# Patient Record
Sex: Male | Born: 1940 | Race: Black or African American | Hispanic: No | Marital: Single | Smoking: Former smoker
Health system: Southern US, Community
[De-identification: ages and names within clinical notes are randomized; demographics above are authoritative.]

## PROBLEM LIST (undated history)

## (undated) DIAGNOSIS — G9341 Metabolic encephalopathy: Secondary | ICD-10-CM

## (undated) DIAGNOSIS — J449 Chronic obstructive pulmonary disease, unspecified: Secondary | ICD-10-CM

## (undated) DIAGNOSIS — S0590XA Unspecified injury of unspecified eye and orbit, initial encounter: Secondary | ICD-10-CM

## (undated) DIAGNOSIS — F1910 Other psychoactive substance abuse, uncomplicated: Secondary | ICD-10-CM

## (undated) DIAGNOSIS — N189 Chronic kidney disease, unspecified: Secondary | ICD-10-CM

## (undated) DIAGNOSIS — F1027 Alcohol dependence with alcohol-induced persisting dementia: Secondary | ICD-10-CM

## (undated) DIAGNOSIS — I10 Essential (primary) hypertension: Secondary | ICD-10-CM

## (undated) HISTORY — DX: Chronic obstructive pulmonary disease, unspecified: J44.9

## (undated) HISTORY — DX: Chronic kidney disease, unspecified: N18.9

## (undated) HISTORY — DX: Alcohol dependence with alcohol-induced persisting dementia: F10.27

## (undated) HISTORY — DX: Metabolic encephalopathy: G93.41

## (undated) HISTORY — DX: Other psychoactive substance abuse, uncomplicated: F19.10

## (undated) HISTORY — DX: Essential (primary) hypertension: I10

## (undated) HISTORY — DX: Unspecified injury of unspecified eye and orbit, initial encounter: S05.90XA

---

## 2004-09-27 ENCOUNTER — Emergency Department: Payer: Self-pay | Admitting: Emergency Medicine

## 2004-09-29 ENCOUNTER — Emergency Department: Payer: Self-pay | Admitting: Emergency Medicine

## 2004-10-07 ENCOUNTER — Emergency Department: Payer: Self-pay | Admitting: Emergency Medicine

## 2004-10-12 ENCOUNTER — Emergency Department: Payer: Self-pay | Admitting: Internal Medicine

## 2004-10-16 ENCOUNTER — Emergency Department: Payer: Self-pay | Admitting: Unknown Physician Specialty

## 2013-01-12 ENCOUNTER — Observation Stay: Payer: Self-pay | Admitting: Internal Medicine

## 2013-01-12 LAB — TROPONIN I
Troponin-I: <0.02 ng/mL
Troponin-I: <0.02 ng/mL

## 2013-01-12 LAB — CBC
MCH: 30.8 pg (ref 26.0–34.0)
MCHC: 35.7 g/dL (ref 32.0–36.0)
MCV: 86 fL (ref 80–100)
Platelet: 220 10*3/uL (ref 150–440)
RBC: 4.84 10*6/uL (ref 4.40–5.90)
RDW: 13.9 % (ref 11.5–14.5)
WBC: 6.7 10*3/uL (ref 3.8–10.6)

## 2013-01-12 LAB — CK TOTAL AND CKMB (NOT AT ARMC)
CK, Total: 371 U/L — ABNORMAL HIGH (ref 35–232)
CK, Total: 331 U/L — ABNORMAL HIGH (ref 35–232)
CK-MB: 4.8 ng/mL — ABNORMAL HIGH (ref 0.5–3.6)
CK-MB: 3.6 ng/mL (ref 0.5–3.6)

## 2013-01-12 LAB — URINALYSIS, COMPLETE
Bacteria: NONE SEEN
Bilirubin,UR: NEGATIVE
Blood: NEGATIVE
Glucose,UR: NEGATIVE mg/dL (ref 0–75)
Ketone: NEGATIVE
Leukocyte Esterase: NEGATIVE
Nitrite: NEGATIVE
RBC,UR: <1 /HPF (ref 0–5)
Squamous Epithelial: NONE SEEN
WBC UR: NONE SEEN /HPF (ref 0–5)

## 2013-01-12 LAB — COMPREHENSIVE METABOLIC PANEL
Anion Gap: 7 (ref 7–16)
BUN: 22 mg/dL — ABNORMAL HIGH (ref 7–18)
Bilirubin,Total: 0.8 mg/dL (ref 0.2–1.0)
Co2: 26 mmol/L (ref 21–32)
Glucose: 91 mg/dL (ref 65–99)
Osmolality: 280 (ref 275–301)
Potassium: 4 mmol/L (ref 3.5–5.1)
SGPT (ALT): 47 U/L (ref 12–78)
Total Protein: 7.7 g/dL (ref 6.4–8.2)

## 2013-01-12 LAB — MAGNESIUM: Magnesium: 1.8 mg/dL

## 2013-01-12 LAB — LIPASE, BLOOD: Lipase: 146 U/L (ref 73–393)

## 2013-01-13 LAB — CBC WITH DIFFERENTIAL/PLATELET
Basophil #: 0 10*3/uL (ref 0.0–0.1)
Eosinophil #: 0 10*3/uL (ref 0.0–0.7)
Eosinophil %: 0 %
HGB: 14.2 g/dL (ref 13.0–18.0)
Lymphocyte %: 36.1 %
MCHC: 35.9 g/dL (ref 32.0–36.0)
MCV: 86 fL (ref 80–100)
Platelet: 199 10*3/uL (ref 150–440)
RBC: 4.61 10*6/uL (ref 4.40–5.90)
RDW: 14 % (ref 11.5–14.5)

## 2013-01-13 LAB — COMPREHENSIVE METABOLIC PANEL
Alkaline Phosphatase: 63 U/L (ref 50–136)
Calcium, Total: 9.2 mg/dL (ref 8.5–10.1)
Creatinine: 1.33 mg/dL — ABNORMAL HIGH (ref 0.60–1.30)
EGFR (African American): >60
EGFR (Non-African Amer.): 54 — ABNORMAL LOW
Glucose: 90 mg/dL (ref 65–99)
Osmolality: 281 (ref 275–301)
Potassium: 4 mmol/L (ref 3.5–5.1)
SGPT (ALT): 40 U/L (ref 12–78)
Sodium: 139 mmol/L (ref 136–145)
Total Protein: 7.2 g/dL (ref 6.4–8.2)

## 2013-01-13 LAB — BASIC METABOLIC PANEL
BUN: 24 mg/dL — ABNORMAL HIGH (ref 7–18)
Calcium, Total: 9.3 mg/dL (ref 8.5–10.1)
Co2: 26 mmol/L (ref 21–32)
EGFR (African American): 58 — ABNORMAL LOW
EGFR (Non-African Amer.): 50 — ABNORMAL LOW
Glucose: 97 mg/dL (ref 65–99)
Osmolality: 276 (ref 275–301)
Potassium: 4.1 mmol/L (ref 3.5–5.1)
Sodium: 136 mmol/L (ref 136–145)

## 2013-01-13 LAB — LIPID PANEL
Cholesterol: 204 mg/dL — ABNORMAL HIGH (ref 0–200)
HDL Cholesterol: 83 mg/dL — ABNORMAL HIGH (ref 40–60)
Triglycerides: 83 mg/dL (ref 0–200)
VLDL Cholesterol, Calc: 17 mg/dL (ref 5–40)

## 2013-01-13 LAB — TROPONIN I: Troponin-I: <0.02 ng/mL

## 2013-01-13 LAB — CK TOTAL AND CKMB (NOT AT ARMC)
CK, Total: 315 U/L — ABNORMAL HIGH (ref 35–232)
CK-MB: 3.3 ng/mL (ref 0.5–3.6)

## 2013-03-17 ENCOUNTER — Emergency Department: Payer: Self-pay | Admitting: Emergency Medicine

## 2013-03-17 LAB — CBC
HGB: 14.6 g/dL (ref 13.0–18.0)
MCHC: 36.6 g/dL — ABNORMAL HIGH (ref 32.0–36.0)
MCV: 84 fL (ref 80–100)
Platelet: 225 10*3/uL (ref 150–440)
WBC: 6.3 10*3/uL (ref 3.8–10.6)

## 2013-03-17 LAB — TROPONIN I
Troponin-I: <0.02 ng/mL
Troponin-I: 0.04 ng/mL

## 2013-03-17 LAB — BASIC METABOLIC PANEL
BUN: 26 mg/dL — ABNORMAL HIGH (ref 7–18)
Chloride: 106 mmol/L (ref 98–107)
Creatinine: 1.37 mg/dL — ABNORMAL HIGH (ref 0.60–1.30)
Glucose: 87 mg/dL (ref 65–99)
Osmolality: 278 (ref 275–301)
Sodium: 137 mmol/L (ref 136–145)

## 2014-03-29 ENCOUNTER — Observation Stay: Payer: Self-pay | Admitting: Internal Medicine

## 2014-03-29 LAB — COMPREHENSIVE METABOLIC PANEL
ALBUMIN: 3.9 g/dL (ref 3.4–5.0)
ANION GAP: 8 (ref 7–16)
AST: 30 U/L (ref 15–37)
Alkaline Phosphatase: 65 U/L
BUN: 30 mg/dL — ABNORMAL HIGH (ref 7–18)
Bilirubin,Total: 0.5 mg/dL (ref 0.2–1.0)
CALCIUM: 9 mg/dL (ref 8.5–10.1)
CO2: 25 mmol/L (ref 21–32)
Chloride: 104 mmol/L (ref 98–107)
Creatinine: 1.53 mg/dL — ABNORMAL HIGH (ref 0.60–1.30)
EGFR (African American): 52 — ABNORMAL LOW
EGFR (Non-African Amer.): 45 — ABNORMAL LOW
GLUCOSE: 112 mg/dL — AB (ref 65–99)
Osmolality: 281 (ref 275–301)
POTASSIUM: 3.4 mmol/L — AB (ref 3.5–5.1)
SGPT (ALT): 27 U/L
Sodium: 137 mmol/L (ref 136–145)
TOTAL PROTEIN: 8 g/dL (ref 6.4–8.2)

## 2014-03-29 LAB — CBC
HCT: 42.4 % (ref 40.0–52.0)
HGB: 14.5 g/dL (ref 13.0–18.0)
MCH: 30.6 pg (ref 26.0–34.0)
MCHC: 34.1 g/dL (ref 32.0–36.0)
MCV: 90 fL (ref 80–100)
Platelet: 204 10*3/uL (ref 150–440)
RBC: 4.73 10*6/uL (ref 4.40–5.90)
RDW: 14.2 % (ref 11.5–14.5)
WBC: 9.2 10*3/uL (ref 3.8–10.6)

## 2014-03-29 LAB — URINALYSIS, COMPLETE
BILIRUBIN, UR: NEGATIVE
Bacteria: NONE SEEN
GLUCOSE, UR: NEGATIVE mg/dL (ref 0–75)
KETONE: NEGATIVE
Leukocyte Esterase: NEGATIVE
Nitrite: NEGATIVE
PH: 5 (ref 4.5–8.0)
Protein: NEGATIVE
RBC,UR: <1 /HPF (ref 0–5)
Specific Gravity: 1.018 (ref 1.003–1.030)
Squamous Epithelial: NONE SEEN
WBC UR: 1 /HPF (ref 0–5)

## 2014-03-29 LAB — DRUG SCREEN, URINE
AMPHETAMINES, UR SCREEN: NEGATIVE (ref ?–1000)
BARBITURATES, UR SCREEN: NEGATIVE (ref ?–200)
BENZODIAZEPINE, UR SCRN: NEGATIVE (ref ?–200)
Cannabinoid 50 Ng, Ur ~~LOC~~: NEGATIVE (ref ?–50)
Cocaine Metabolite,Ur ~~LOC~~: POSITIVE (ref ?–300)
MDMA (Ecstasy)Ur Screen: NEGATIVE (ref ?–500)
Methadone, Ur Screen: NEGATIVE (ref ?–300)
Opiate, Ur Screen: NEGATIVE (ref ?–300)
Phencyclidine (PCP) Ur S: NEGATIVE (ref ?–25)
Tricyclic, Ur Screen: NEGATIVE (ref ?–1000)

## 2014-03-29 LAB — TROPONIN I
TROPONIN-I: 0.12 ng/mL — AB
TROPONIN-I: 0.23 ng/mL — AB
Troponin-I: 0.04 ng/mL
Troponin-I: 0.21 ng/mL — ABNORMAL HIGH
Troponin-I: 0.13 ng/mL — ABNORMAL HIGH

## 2014-03-29 LAB — ETHANOL: Ethanol: <3 mg/dL

## 2014-03-30 LAB — BASIC METABOLIC PANEL
Anion Gap: 8 (ref 7–16)
BUN: 23 mg/dL — AB (ref 7–18)
CHLORIDE: 109 mmol/L — AB (ref 98–107)
CO2: 24 mmol/L (ref 21–32)
CREATININE: 1.27 mg/dL (ref 0.60–1.30)
Calcium, Total: 8.8 mg/dL (ref 8.5–10.1)
EGFR (African American): >60
EGFR (Non-African Amer.): 56 — ABNORMAL LOW
GLUCOSE: 88 mg/dL (ref 65–99)
Osmolality: 284 (ref 275–301)
Potassium: 4.3 mmol/L (ref 3.5–5.1)
Sodium: 141 mmol/L (ref 136–145)

## 2014-03-30 LAB — CBC WITH DIFFERENTIAL/PLATELET
BASOS ABS: 0 10*3/uL (ref 0.0–0.1)
Basophil %: 0.2 %
EOS PCT: 0 %
Eosinophil #: 0 10*3/uL (ref 0.0–0.7)
HCT: 45.4 % (ref 40.0–52.0)
HGB: 15.6 g/dL (ref 13.0–18.0)
LYMPHS ABS: 1.9 10*3/uL (ref 1.0–3.6)
Lymphocyte %: 28.9 %
MCH: 31 pg (ref 26.0–34.0)
MCHC: 34.4 g/dL (ref 32.0–36.0)
MCV: 90 fL (ref 80–100)
MONOS PCT: 13.2 %
Monocyte #: 0.9 x10 3/mm (ref 0.2–1.0)
Neutrophil #: 3.7 10*3/uL (ref 1.4–6.5)
Neutrophil %: 57.7 %
Platelet: 196 10*3/uL (ref 150–440)
RBC: 5.03 10*6/uL (ref 4.40–5.90)
RDW: 14 % (ref 11.5–14.5)
WBC: 6.5 10*3/uL (ref 3.8–10.6)

## 2014-04-27 LAB — CBC AND DIFFERENTIAL
HCT: 40 % — AB (ref 41–53)
HEMOGLOBIN: 13.7 g/dL (ref 13.5–17.5)
PLATELETS: 214 10*3/uL (ref 150–399)
WBC: 5.5 10^3/mL

## 2014-04-27 LAB — TSH: TSH: 2.33 u[IU]/mL (ref <=5.90)

## 2014-04-27 LAB — PSA: PSA: 0.5

## 2014-04-27 LAB — HEPATIC FUNCTION PANEL
Alkaline Phosphatase: 68 U/L (ref 25–125)
BILIRUBIN, TOTAL: 0.8 mg/dL

## 2014-04-27 LAB — LIPID PANEL: Cholesterol: 183 mg/dL (ref 0–200)

## 2014-06-04 LAB — HEPATIC FUNCTION PANEL
ALT: 25 U/L (ref 10–40)
AST: 32 U/L (ref 14–40)

## 2014-06-04 LAB — LIPID PANEL
HDL: 91 mg/dL — AB (ref 35–70)
LDL CALC: 77 mg/dL
TRIGLYCERIDES: 76 mg/dL (ref 40–160)

## 2014-06-04 LAB — BASIC METABOLIC PANEL
BUN: 16 mg/dL (ref 4–21)
Creatinine: 1.2 mg/dL (ref 0.6–1.3)
GLUCOSE: 111 mg/dL
POTASSIUM: 3.9 mmol/L (ref 3.4–5.3)
SODIUM: 140 mmol/L (ref 137–147)

## 2014-06-04 LAB — HEMOGLOBIN A1C: Hgb A1c MFr Bld: 5.6 % (ref 4.0–6.0)

## 2014-11-09 NOTE — Discharge Summary (Signed)
PATIENT NAME:  Chad Banks, Chad Banks MR#:  [id number] DATE OF BIRTH:  1941/01/07  DATE OF ADMISSION:  01/12/2013 DATE OF DISCHARGE:  01/13/2013  ADMITTING DIAGNOSIS:  Chest pain.   DISCHARGE DIAGNOSES: 1.  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  2.  Unremarkable stress test for inducible ischemia.  3.  Cardiomyopathy with ejection fraction of 43% on Myoview.  Echo is pending.  4.  Acute renal failure, improved with blood pressure management.  5.  Tobacco abuse.   DISCHARGE CONDITION:  Stable.   DISCHARGE MEDICATIONS:  The patient is to be started on: 1.  Nitroglycerin 0.6 mg an hour topical patch once a day.  2.  Metoprolol succinate 50 mg by mouth once daily.  3.  Amlodipine 5 mg by mouth daily.  4.  Nicotine oral inhaler up to 6 times daily as needed.  5.  Nicotine transdermal patch 7 mg daily.    HOME OXYGEN:  None.   DIET:  2 gram salt, low fat, low cholesterol, regular consistency.   ACTIVITY LIMITATIONS:  As tolerated.   FOLLOW-UP APPOINTMENT:  With Dr. Saralyn Pilar in one week after discharge.    CONSULTANTS:  Dr. Saralyn Pilar.   RADIOLOGIC STUDIES:  Chest x-ray, PA and lateral, 01/12/2013 showed mild hyperinflation which may reflect COPD or reactive airway disease.  No evidence of pneumonia or CHF or other acute cardiopulmonary abnormality.  Myoview stress test 01/13/2013 showed exercise myocardial perfusion study with no significant ischemia, estimated ejection fraction of 43%.  No EKG changes concerning for ischemia.  No artifact noted in this study.   HOSPITAL COURSE:  The patient is a 74 year old African American male with past medical history significant for unknown past medical history since he did not see primary care physician for a long period of time who presented to the hospital with complaints of chest tightness, chest pains.  Please refer to Dr. Keenan Bachelor admission on 01/12/2013.  On arrival to the Emergency Room, patient's  systolic blood pressure was above 210 and his EKG showed sinus rhythm with sinus arrhythmia, occasional premature ventricular complexes at 88 beats per minute, normal axis.  No acute ST-T changes were noted.  Repeat EKG also was unremarkable.  The patient's lab data done on admission showed mild elevation of BUN and creatinine to 22 and 1.53, otherwise BMP was unremarkable.  The patient's lipase level was normal at 146.  The patient's AST was elevated at 55, otherwise liver enzymes were normal.  CK total was elevated at 371 on the first set with MB fraction elevation up to 4.8.  Troponin was normal at less than 0.02.  CK total was elevated at 331 with normal MB fraction as well as troponin and on the second set as well as elevation of CK total of 314 with normal CK-MB fraction as well as troponin levels on the third set.  CBC was unremarkable, within normal limits.  Urinalysis was unremarkable.  The patient was admitted to the hospital for further evaluation.  His cardiac enzymes were cycled and he underwent stress test on 01/13/2013.  It was felt that the patient had no inducible ischemia during stress testing.  His EKG was unremarkable.  His estimated ejection fraction however was found to be low at 43%.  It was felt that the patient's chest pain was very likely related to hypertension and patient's cardiomyopathy was also attributed to hypertension which was poorly managed.  In regards to hypertension, the patient was started on medications  and medications were advanced to current levels.  We were not able to initiate patient on ACE inhibitor or ARB due to renal insufficiency.  On day of discharge, 01/13/2013, the patient's vital signs are stable.  Temperature of 98.2, pulse ranging from 50s to 60, respiration rate 18 to 16, blood pressure ranging from 509 systolic to 326 systolic and 71I to 45Y diastolic and O2 sats were 95% to 98% on room air at rest.  The patient was advised to follow up with Dr. Saralyn Pilar for  further management of his hypertension.  Echocardiogram was ordered, however those results are still pending.  In regards to acute renal failure, initially the patient's acute renal failure somewhat improved with IV fluid administration from 1.53 to 1.33 on 01/13/2013, however it did not resolve completely and repeated lab studies revealed still persistent elevation of creatinine signifying likely chronic renal insufficiency.  The patient's other risk factors were evaluated for acute coronary syndrome or cardiovascular disease and lipid panel was performed.  LDL was found to be elevated at 104.  Total cholesterol was high at 204, triglycerides were 83 and HDL was 83.  We did not feel at this point since the patient's (Dictation Anomaly) Myoview was unremarkable that we need to initiate him on statins, however we recommended low fat, low cholesterol diet upon discharge.  The patient is being discharged in stable condition with the above-mentioned medications and follow-up.   TIME SPENT:  40 minutes.    ____________________________ Theodoro Grist, MD rv:ea D: 01/13/2013 16:25:00 ET T: 01/13/2013 23:46:45 ET JOB#: [id number]  cc: Isaias Cowman, MD Theodoro Grist, MD, <Dictator>  [place] MD ELECTRONICALLY SIGNED 01/27/2013 17:31

## 2014-11-09 NOTE — H&P (Signed)
PATIENT NAME:  Chad Banks, Chad Banks MR#:  [id number] DATE OF BIRTH:  03-Jun-1941  DATE OF ADMISSION:  01/12/2013  PRIMARY CARE PHYSICIAN: None.  HISTORY OF PRESENT ILLNESS:  The patient is a 74 year old African American male with past medical history which is un-significant since he did not see any primary physician for numerous years presents to the hospital with complaints of chest tightness, chest pains.  According to the patient, he was doing well up until the morning of day of admission when he started cleaning in his kitchen and started having tightness in his chest. He felt somewhat sweaty. Pain was described as tightness, 5 out of 10 by intensity, intermittent, lasting for approximately 5 minutes, with alleviation and exacerbations.  The patient stated that the first time it started it lasted approximately 5 minutes then he sat down and he felt somewhat okay. He called EMS.  On arrival to the hospital, to the Emergency Room, he had recurrent chest pain. Pain was much more intense, 7 out of 10 by intensity, and lasted approximately 10 minutes.  After nitroglycerin was given, the patient's chest pain resolved. He does not have any pain now. He denies any radiation. He denies any alleviating or aggravating factors and states that he was short of breath as well as sweating, however, he denies any presyncope or palpitations. Hospitalist services were contacted for admission.   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   MEDICATIONS: Tylenol as needed for headache.   PAST SURGICAL HISTORY: The patient had a chainsaw injury on his left leg and suturing was performed with no other surgeries.  ALLERGIES: No known drug allergies.   FAMILY HISTORY: Negative for early coronary artery disease and hypertension.  Diabetes in the patient's brother who died at the age of 55. The patient's other brother had some kind of cancer. No strokes.   SOCIAL HISTORY: The  patient is single, has 2 kids, 1 daughter as well as 1 son in [place].  Smokes for approximately 60 years now. Now he is down to 3 cigarettes a day, but he has been smoking for 60 years.  He drinks  approximately 4 to 5 beers a day. 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   REVIEW OF SYSTEMS: Positive for pains in the chest, some wheezing, some shortness of breath intermittently especially whenever he exerts himself. He sometimes coughs and produces yellowish phlegm. Admits of some constipation. CONSTITUTIONAL: Denies any fevers, chills, fatigue, weakness, weight loss or gain.  EYES:  No blurry vision, double vision, glaucoma or cataracts. ENT: Denies any tinnitus, allergies, epistaxis, sinus tenderness, dentures or difficulty swallowing. RESPIRATORY: Denies any hemoptysis, asthma or COPD. CARDIOVASCULAR: Denies any orthopnea, arrhythmias, palpitations or syncope.  GASTROINTESTINAL: Denies any nausea, vomiting, diarrhea or constipation. GENITOURINARY: Denies dysuria, hematuria, frequency or incontinence. ENDOCRINOLOGY:  Denies any polydipsia, nocturia, thyroid problems, heat or cold intolerance or thirst. HEMATOLOGIC: Denies anemia, easy bruising, bleeding or swollen glands. SKIN:  No acne, rashes, lesions or change in moles.  MUSCULOSKELETAL:  Denies arthritis, cramps, swelling or gout. NEUROLOGIC: Denies numbness, epilepsy or tremor. PSYCHIATRIC: Denies anxiety, insomnia or depression.   PHYSICAL EXAMINATION: VITAL SIGNS: On arrival to the hospital:  Temperature is 97.8, pulse was 95, respiratory rate was 18, blood pressure was 222/101 and saturation was 95% on room air.  GENERAL: This is a well-developed, well-nourished thin African American male in no significant distress lying on the stretcher.  HEENT:  The right eye corneal opacification is noted and no pupil was noted. On the left eye, his pupil is approximately 2 mm, reactive to light. Extraocular movements intact. No icterus or  conjunctivitis. He has normal hearing. No pharyngeal erythema. Mucosa is moist. No masses (Dictation Anomaly) or pain on palpation No adenopathy. No JVD. Full range of motion.  LUNGS: Clear to auscultation in all fields. Minimally diminished breath sounds, but otherwise no labored inspirations, increased effort or dullness to percussion. Not in overt respiratory distress.  HEART:  S1 and S2 appreciated. No murmurs, gallops or rubs were noted. Minimal if any  systolic murmur was heard precordially, 2/6. PMI not lateralized. Chest is nontender to palpation.  EXTREMITIES: 1+ pedal pulses. No lower extremity edema, clubbing or cyanosis was noted.  ABDOMEN: Soft, nontender. Bowel sounds are present. No hepatosplenomegaly or masses were noted.  RECTAL: Deferred.  MUSCLE STRENGTH: Able to move all extremities. No cyanosis, degenerative joint disease or kyphosis. Gait was not tested.  SKIN: Did not reveal any rashes, lesions, erythema, nodularity or induration. It was warm and dry to palpation.  LYMPHATIC: No adenopathy in the cervical region.  NEUROLOGICAL: Cranial nerves grossly intact. Sensory is intact. No dysarthria or aphasia. The patient is alert and oriented to time, person and place, cooperative. Memory is good. Denies any confusion, agitation or depression.   LABORATORY AND DIAGNOSTIC DATA: EKG done in the Emergency Room showed, first one, at around 10:08 in the morning, revealed sinus rhythm with sinus arrhythmia and occasional premature ventricular complexes at 88 beats per minute.  Normal axis. No acute ST-T changes. Repeat EKG done at around 10:42 showed normal sinus rhythm, normal EKG.   The patient's lab data, BUN and creatinine were 22 and 1.53 respectively. Otherwise, BMP was unremarkable. The patient's lipase level was normal at 146. Liver enzymes revealed elevated AST to 55 and CK total was elevated to 321. MB fraction was 4.8 and troponin was less than 0.02. White blood cell count was  normal at 6.7, hemoglobin was 14.9 and platelet count 220. Urinalysis: Yellow clear urine, negative for glucose, bilirubin or ketones. Specific gravity 1.017, pH was 5.0, negative for blood, protein, nitrites or leukocyte esterase, less than 1 red blood cell, no white blood cells, and no bacteria or epithelial cells were noted.   RADIOLOGIC STUDIES: Chest x-ray, PA and lateral, 01/12/2013 revealed mild hyperinflation which may reflect COPD or reactive airway disease. No evidence of pneumonia or CHF.  No other acute cardiopulmonary abnormality, according to radiologist.   ASSESSMENT AND PLAN: 1.  Chest pain, precordial chest pain. Admit the patient to the medical floor. We will start him on metoprolol, also nitroglycerin topically. We will also start him on aspirin and heparin subcutaneously. We will get cardiac enzymes x 3 and we will get Myoview stress test in the morning.  2.  Malignant hypertension. Will continue metoprolol and nitroglycerin. Also add Norvasc. Unable to use ACE inhibitor due to renal failure.  3.  Renal failure, acute versus chronic.  Likely due to long-standing hypertension. We will follow with blood pressure management.  4.  Tobacco abuse. Discussed with the patient for approximately 3 minutes. Nicotine replacement therapy will be initiated.  5.  Alcohol abuse. Watch for withdrawal.  6.  Elevated transaminases, possibly related to alcohol use. Follow was conservative management.   TIME SPENT: 50 minutes. ____________________________ Theodoro Grist, MD rv:sb D: 01/12/2013 12:32:10 ET T: 01/12/2013 13:09:15 ET JOB#: [id number]  cc: Theodoro Grist, MD, <Dictator> [place]  MD ELECTRONICALLY SIGNED 01/30/2013 19:45

## 2014-11-10 NOTE — H&P (Signed)
PATIENT NAME:  Chad Banks, Chad Banks MR#:  [id number] DATE OF BIRTH:  May 08, 1941  DATE OF ADMISSION:  03/29/2014  PRIMARY CARE PHYSICIAN: None    REQUESTING PHYSICIAN: Lenise Arena, M.D.   CHIEF COMPLAINT: Weakness and confusion.   HISTORY OF PRESENT ILLNESS: The patient is a 74 year old male with a known history of hypertension and alcohol abuse, is being admitted for elevated troponin, confusion, and weakness. The patient was brought into the Emergency Department by EMS for generalized weakness.  As per EMS records, the patient met the ambulance crew at the ambulance. He was reportedly out of medications. His blood pressure was 143/117 and he was found to be confused, was brought down to the Emergency Department.  He was found to have blood pressure of 216/98 and initial labs showed borderline troponin for which he is being admitted for further evaluation and management.  When I evaluated the patient, he was very confused and could not give me any history. He did not know that he is in the hospital. He was also found to have positive cocaine on urine toxicology.  He is being admitted for further evaluation and management.   PAST MEDICAL HISTORY: 1.  Hypertension.  2.  Alcohol abuse.  3. 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   ALLERGIES: No known drug allergies.   MEDICATIONS AT HOME: 1.  Nitroglycerin sublingual as needed.  2.  Metoprolol 50 mg p.o. daily.  3.  Amlodipine 5 mg p.o. daily.   PAST SURGICAL HISTORY: Chain saw injury on his left leg and suturing.   FAMILY HISTORY: Brother had some kind of cancer, per record.  Also, had diabetes in another brother.   SOCIAL HISTORY: The patient is single, has 2 kids, one daughter as well as one son in [place]. He is a smoker and drinks 4 to 5 beers a day. He is retired. These are all based on the old records.  The patient is not able to provide any history, neither.  I was unable to connect with any of the family  members, so most of the information is per the records.   REVIEW OF SYSTEMS: Unobtainable as the patient is confused.   PHYSICAL EXAMINATION: VITAL SIGNS: Temperature 97.5, heart rate 82 per minute, respirations 21 per minute, blood pressure 216/98, saturating 98% on room air.  GENERAL:  The patient is a 74 year old male lying in the bed comfortably without any acute distress.  EYES: Pupils equal, round, reactive to light and accommodation. No scleral icterus. Extraocular muscles intact.  HENT: Head atraumatic, normocephalic. Oropharynx and nasopharynx clear.  NECK: Supple. No jugular venous distention. No thyroid enlargement or tenderness.  LUNGS: Clear to auscultation bilaterally. No wheezing, rales, rhonchi, or crepitation.  CARDIOVASCULAR: S1, S2 normal. No murmurs, rubs, or gallop.  ABDOMEN: Soft, nontender, nondistended. Bowel sounds present. No organomegaly or masses.  EXTREMITIES: No pedal edema, cyanosis or clubbing.  NEUROLOGIC: Cranial nerves III through XII seem intact.  Sensation intact. Muscle strength 5/5 in all extremities. Coordination difficult to evaluate as the patient would not cooperate. Gait not checked.  PSYCHIATRIC: The patient is alert, but he is not oriented. He seems very confused and seemed to be hallucinating.  MUSCULOSKELETAL: No joint effusion or tenderness.   LABORATORY DATA: Normal liver function tests. Normal CBC. BMP within normal limits except BUN of 30, creatinine 1.53, potassium of 3.4. Urine toxicology positive for cocaine. Urinalysis was negative.   IMAGING: EKG did not show any acute ST-T  changes. Chest x-ray showed no acute cardiopulmonary disease.   IMPRESSION AND PLAN: 1.  Metabolic encephalopathy with confusion, likely multifactorial, possibly from alcohol withdrawal as the patient is still confused and he does have a reported history of cocaine and alcohol abuse. He is not oriented. I was unable to contact any family members at this time.  Doubt  he is on any medication. He is likely medically noncompliant. We will monitor.  2.  Possible unstable angina, although doubt.  He is not complaining of any pain at this time. If it is, could be cocaine-induced as his urine toxicology is positive. We will monitor him on telemetry. Consider Myoview in the morning depending on cardiology consultation.  3.  Elevated troponin, likely due to cocaine from coronary artery spasm. We will consult cardiology.  Avoid beta-blockers.  4.  Hypokalemia. We will replete and recheck. Check magnesium.  5.  Possible alcohol withdrawal with disorientation.  We will put him on CIWA protocol and consult neurology at this time for mental status evaluation. 6. Code status: Full code. I did try to reach out to all the available numbers listed in the computer, including different family members and friends, but was not able to reach anyone so far.   TOTAL TIME TAKING CARE OF THIS PATIENT: Is 45 minutes.     ____________________________ Lucina Mellow. Manuella Ghazi, MD vss:lr D: 03/29/2014 17:19:55 ET T: 03/29/2014 18:27:25 ET JOB#: [id number]  cc:   Remer Macho MD ELECTRONICALLY SIGNED 04/02/2014 11:48

## 2014-11-10 NOTE — Discharge Summary (Signed)
PATIENT NAME:  Chad Banks, Chad Banks MR#:  [id number] DATE OF BIRTH:  1941/02/11  DATE OF ADMISSION:  03/29/2014 DATE OF DISCHARGE:  03/30/2014   DISCHARGE DIAGNOSES: 1.  Atypical chest pain, likely noncardiac. Negative Myoview, borderline cardiac enzymes, likely due to supply demand ischemia. No myocardial infarction.  2.  Metabolic encephalopathy, likely due to alcoholism and some signs of withdrawal.  3.  Polysubstance abuse with urine toxicology positive for cocaine.   SECONDARY DIAGNOSIS: Hypertension.   CONSULTATION: Cardiology, Javier Docker. Ubaldo Glassing, MD    PROCEDURES AND RADIOLOGY:  1.  Myoview on 11th of September showed no significant wall motion abnormality. Ejection fraction of 43%. No significant ischemia.  2.  Chest x-ray on 10th of September showed no acute cardiopulmonary disease.   MAJOR LABORATORY PANEL: Urinalysis on admission was negative.   HISTORY AND SHORT HOSPITAL COURSE: The patient is a 74 year old male with the above-mentioned medical problems, who was admitted for confusion and weakness along with borderline elevated troponin. He did see Dr. Trena Platt dictated history and physical for further details concerning borderline elevated troponin with some concern for atypical chest pain. Cardiology consultation was obtained with Dr. Bartholome Bill, who recommended Myoview, which was obtained showing no acute ischemia. The patient's mental status was improved. His urine toxicology came back positive for cocaine, which was thought to be likely culprit for most of the patient's symptoms. He was much more alert and back to his normal baseline and was discharged home in stable condition on 11th of September.   PERTINENT PHYSICAL EXAMINATION:  VITAL SIGNS:  On the date of discharge: Temperature 97.7, heart rate 89 per minute, respirations 18 per minute, blood pressure 128/82. He was saturating 95% on room air.  CARDIOVASCULAR: S1, S2 normal. No murmurs, rubs, or gallop.  LUNGS: Clear to  auscultation bilaterally. No wheezing, rales, rhonchi, or crepitation.  ABDOMEN: Soft, benign.  NEUROLOGIC: Nonfocal examination.   All other physical examination remained at the baseline.   DISCHARGE MEDICATIONS: 1.  Amlodipine 5 mg p.o. daily.  2.  Metoprolol ER 50 mg p.o. daily.  3.  Nitroglycerin topical to affected area once a Chad Banks.  4.  Clonidine 0.1 mg p.o. b.i.d.   DISCHARGE DIET: Low sodium.   DISCHARGE ACTIVITY: As tolerated.   DISCHARGE INSTRUCTIONS AND FOLLOWUP: The patient was instructed to follow up with new primary care physician at [facility] in 1 to 2 weeks. He was counseled strongly against any drugs of abuse and alcoholism.   TOTAL TIME DISCHARGING THIS PATIENT: Forty minutes.    ____________________________ Lucina Mellow. Manuella Ghazi, MD vss:lr D: 04/02/2014 10:59:21 ET T: 04/02/2014 14:00:38 ET JOB#: [id number]  cc: Jakory Matsuo S. Manuella Ghazi, MD, <Dictator> Javier Docker. Ubaldo Glassing, MD  Remer Macho MD ELECTRONICALLY SIGNED 04/05/2014 11:38

## 2014-11-10 NOTE — Consult Note (Signed)
   Present Illness 74 yo male with history of substance abuse but no prior cardiac history per his report who presented to the er with complaints of upper abdominal pain extending across his upper abdomen on both sides. He denies midsternal chest pain or sob. EKG is normal Serum troponin was mildly elevated 0.04 and 0.12. He states his pain has improved. He admits to cocaine abuse and has positive cocaine in his urine.   Physical Exam:  GEN no acute distress, thin   HEENT poor dentition, right eye blindness.   NECK No masses   RESP normal resp effort  no use of accessory muscles   CARD Regular rate and rhythm   ABD positive tenderness  normal BS   LYMPH negative neck, negative axillae   EXTR negative cyanosis/clubbing, negative edema   SKIN normal to palpation   NEURO cranial nerves intact, motor/sensory function intact   PSYCH alert, poor insight   Review of Systems:  Subjective/Chief Complaint mid and upper abdominal pain   General: Fatigue   Skin: No Complaints   ENT: No Complaints   Eyes: Vision difficulty   Neck: No Complaints   Respiratory: No Complaints   Cardiovascular: No Complaints   Gastrointestinal: upper abdominal discomfort   Genitourinary: No Complaints   Vascular: No Complaints   Musculoskeletal: No Complaints   Neurologic: No Complaints   Hematologic: No Complaints   Endocrine: No Complaints   Psychiatric: No Complaints   Review of Systems: All other systems were reviewed and found to be negative   Medications/Allergies Reviewed Medications/Allergies reviewed   Family & Social History:  Family and Social History:  Family History Non-Contributory   Social History positive  tobacco, positive ETOH, positive Illicit drugs   + Tobacco Current (within 1 year)   EKG:  EKG NSR   Abnormal NSSTTW changes    No Known Allergies:    Impression 74 yo male with history of mid abdominal pain with history of cocaine, tobacco and etoh  abuse with active cocaine use. Preented with abdmonial pain. Mild troponin elevation. Normal ekg. Doubt acute coronary event. WOuld agree with proceeding with funcitonal study asap and making further cardiac decisions post test. Avoid beta blockers due to cocaine. Continue to rule out for mi. Further evaluation regarding his abdominal pain. Would hold heparin for now. Stop cocaine use was discussed.   Plan 1. asa 81 mg daily 2. Lexiscan sestimibi 3. Hold beta blockers. 4. Hold heparin 5. Stop cocaine, etoh and tob abuse.  6. Further recs after study.   Electronic Signatures: Teodoro Spray (MD)  (Signed 10-Sep-15 11:23)  Authored: General Aspect/Present Illness, History and Physical Exam, Review of System, Family & Social History, EKG , Allergies, Impression/Plan   Last Updated: 10-Sep-15 11:23 by Teodoro Spray (MD)

## 2014-12-26 ENCOUNTER — Ambulatory Visit (INDEPENDENT_AMBULATORY_CARE_PROVIDER_SITE_OTHER): Payer: Medicare Other | Admitting: Family Medicine

## 2014-12-26 ENCOUNTER — Encounter: Payer: Self-pay | Admitting: Family Medicine

## 2014-12-26 VITALS — BP 150/84 | HR 87 | Temp 97.3°F | Ht 67.6 in | Wt 147.8 lb

## 2014-12-26 DIAGNOSIS — I129 Hypertensive chronic kidney disease with stage 1 through stage 4 chronic kidney disease, or unspecified chronic kidney disease: Secondary | ICD-10-CM | POA: Insufficient documentation

## 2014-12-26 DIAGNOSIS — F1027 Alcohol dependence with alcohol-induced persisting dementia: Secondary | ICD-10-CM | POA: Diagnosis not present

## 2014-12-26 DIAGNOSIS — E785 Hyperlipidemia, unspecified: Secondary | ICD-10-CM

## 2014-12-26 DIAGNOSIS — I739 Peripheral vascular disease, unspecified: Secondary | ICD-10-CM | POA: Diagnosis not present

## 2014-12-26 DIAGNOSIS — I10 Essential (primary) hypertension: Secondary | ICD-10-CM

## 2014-12-26 DIAGNOSIS — N183 Chronic kidney disease, stage 3 unspecified: Secondary | ICD-10-CM

## 2014-12-26 DIAGNOSIS — N189 Chronic kidney disease, unspecified: Secondary | ICD-10-CM | POA: Insufficient documentation

## 2014-12-26 MED ORDER — CLONIDINE HCL 0.1 MG PO TABS: 0.2000 mg | ORAL_TABLET | Freq: Every day | ORAL | Status: DC

## 2014-12-26 MED ORDER — ATORVASTATIN CALCIUM 40 MG PO TABS: 40.0000 mg | ORAL_TABLET | Freq: Every day | ORAL | Status: DC

## 2014-12-26 MED ORDER — ASPIRIN EC 81 MG PO TBEC: 81.0000 mg | DELAYED_RELEASE_TABLET | Freq: Every day | ORAL | Status: DC

## 2014-12-26 NOTE — Assessment & Plan Note (Signed)
More alert and oriented today. Continue to monitor. MMSE next visit.

## 2014-12-26 NOTE — Assessment & Plan Note (Signed)
Not under good control. Better on recheck. Will increase his clonidine to 0.2mg  daily and recheck in 1 month. CMP and microalbumin checked again today. Continue to monitor.

## 2014-12-26 NOTE — Patient Instructions (Addendum)
DASH Eating Plan DASH stands for "Dietary Approaches to Stop Hypertension." The DASH eating plan is a healthy eating plan that has been shown to reduce high blood pressure (hypertension). Additional health benefits may include reducing the risk of type 2 diabetes mellitus, heart disease, and stroke. The DASH eating plan may also help with weight loss. WHAT DO I NEED TO KNOW ABOUT THE DASH EATING PLAN? For the DASH eating plan, you will follow these general guidelines:  Choose foods with a percent daily value for sodium of less than 5% (as listed on the food label).  Use salt-free seasonings or herbs instead of table salt or sea salt.  Check with your health care provider or pharmacist before using salt substitutes.  Eat lower-sodium products, often labeled as "lower sodium" or "no salt added."  Eat fresh foods.  Eat more vegetables, fruits, and low-fat dairy products.  Choose whole grains. Look for the word "whole" as the first word in the ingredient list.  Choose fish and skinless chicken or turkey more often than red meat. Limit fish, poultry, and meat to 6 oz (170 g) each day.  Limit sweets, desserts, sugars, and sugary drinks.  Choose heart-healthy fats.  Limit cheese to 1 oz (28 g) per day.  Eat more home-cooked food and less restaurant, buffet, and fast food.  Limit fried foods.  Cook foods using methods other than frying.  Limit canned vegetables. If you do use them, rinse them well to decrease the sodium.  When eating at a restaurant, ask that your food be prepared with less salt, or no salt if possible. WHAT FOODS CAN I EAT? Seek help from a dietitian for individual calorie needs. Grains Whole grain or whole wheat bread. Brown rice. Whole grain or whole wheat pasta. Quinoa, bulgur, and whole grain cereals. Low-sodium cereals. Corn or whole wheat flour tortillas. Whole grain cornbread. Whole grain crackers. Low-sodium crackers. Vegetables Fresh or frozen vegetables  (raw, steamed, roasted, or grilled). Low-sodium or reduced-sodium tomato and vegetable juices. Low-sodium or reduced-sodium tomato sauce and paste. Low-sodium or reduced-sodium canned vegetables.  Fruits All fresh, canned (in natural juice), or frozen fruits. Meat and Other Protein Products Ground beef (85% or leaner), grass-fed beef, or beef trimmed of fat. Skinless chicken or turkey. Ground chicken or turkey. Pork trimmed of fat. All fish and seafood. Eggs. Dried beans, peas, or lentils. Unsalted nuts and seeds. Unsalted canned beans. Dairy Low-fat dairy products, such as skim or 1% milk, 2% or reduced-fat cheeses, low-fat ricotta or cottage cheese, or plain low-fat yogurt. Low-sodium or reduced-sodium cheeses. Fats and Oils Tub margarines without trans fats. Light or reduced-fat mayonnaise and salad dressings (reduced sodium). Avocado. Safflower, olive, or canola oils. Natural peanut or almond butter. Other Unsalted popcorn and pretzels. The items listed above may not be a complete list of recommended foods or beverages. Contact your dietitian for more options. WHAT FOODS ARE NOT RECOMMENDED? Grains White bread. White pasta. White rice. Refined cornbread. Bagels and croissants. Crackers that contain trans fat. Vegetables Creamed or fried vegetables. Vegetables in a cheese sauce. Regular canned vegetables. Regular canned tomato sauce and paste. Regular tomato and vegetable juices. Fruits Dried fruits. Canned fruit in light or heavy syrup. Fruit juice. Meat and Other Protein Products Fatty cuts of meat. Ribs, chicken wings, bacon, sausage, bologna, salami, chitterlings, fatback, hot dogs, bratwurst, and packaged luncheon meats. Salted nuts and seeds. Canned beans with salt. Dairy Whole or 2% milk, cream, half-and-half, and cream cheese. Whole-fat or sweetened yogurt. Full-fat   cheeses or blue cheese. Nondairy creamers and whipped toppings. Processed cheese, cheese spreads, or cheese  curds. Condiments Onion and garlic salt, seasoned salt, table salt, and sea salt. Canned and packaged gravies. Worcestershire sauce. Tartar sauce. Barbecue sauce. Teriyaki sauce. Soy sauce, including reduced sodium. Steak sauce. Fish sauce. Oyster sauce. Cocktail sauce. Horseradish. Ketchup and mustard. Meat flavorings and tenderizers. Bouillon cubes. Hot sauce. Tabasco sauce. Marinades. Taco seasonings. Relishes. Fats and Oils Butter, stick margarine, lard, shortening, ghee, and bacon fat. Coconut, palm kernel, or palm oils. Regular salad dressings. Other Pickles and olives. Salted popcorn and pretzels. The items listed above may not be a complete list of foods and beverages to avoid. Contact your dietitian for more information. WHERE CAN I FIND MORE INFORMATION? National Heart, Lung, and Blood Institute: travelstabloid.com Document Released: 06/25/2011 Document Revised: 11/20/2013 Document Reviewed: 05/10/2013 [facility] Patient Information 2015 [place], [place]. This information is not intended to replace advice given to you by your health care provider. Make sure you discuss any questions you have with your health care provider. Chronic Kidney Disease Chronic kidney disease occurs when the kidneys are damaged over a long period. The kidneys are two organs that lie on either side of the spine between the middle of the back and the front of the abdomen. The kidneys:   Remove wastes and extra water from the blood.   Produce important hormones. These help keep bones strong, regulate blood pressure, and help create red blood cells.   Balance the fluids and chemicals in the blood and tissues. A small amount of kidney damage may not cause problems, but a large amount of damage may make it difficult or impossible for the kidneys to work the way they should. If steps are not taken to slow down the kidney damage or stop it from getting worse, the kidneys may stop working  permanently. Most of the time, chronic kidney disease does not go away. However, it can often be controlled, and those with the disease can usually live normal lives. CAUSES  The most common causes of chronic kidney disease are diabetes and high blood pressure (hypertension). Chronic kidney disease may also be caused by:   Diseases that cause the kidneys' filters to become inflamed.   Diseases that affect the immune system.   Genetic diseases.   Medicines that damage the kidneys, such as anti-inflammatory medicines.  Poisoning or exposure to toxic substances.   A reoccurring kidney or urinary infection.   A problem with urine flow. This may be caused by:   Cancer.   Kidney stones.   An enlarged prostate in males. SIGNS AND SYMPTOMS  Because the kidney damage in chronic kidney disease occurs slowly, symptoms develop slowly and may not be obvious until the kidney damage becomes severe. A person may have a kidney disease for years without showing any symptoms. Symptoms can include:   Swelling (edema) of the legs, ankles, or feet.   Tiredness (lethargy).   Nausea or vomiting.   Confusion.   Problems with urination, such as:   Decreased urine production.   Frequent urination, especially at night.   Frequent accidents in children who are potty trained.   Muscle twitches and cramps.   Shortness of breath.  Weakness.   Persistent itchiness.   Loss of appetite.  Metallic taste in the mouth.  Trouble sleeping.  Slowed development in children.  Short stature in children. DIAGNOSIS  Chronic kidney disease may be detected and diagnosed by tests, including blood, urine, imaging, or kidney  biopsy tests.  TREATMENT  Most chronic kidney diseases cannot be cured. Treatment usually involves relieving symptoms and preventing or slowing the progression of the disease. Treatment may include:   A special diet. You may need to avoid alcohol and foods  thatare salty and high in potassium.   Medicines. These may:   Lower blood pressure.   Relieve anemia.   Relieve swelling.   Protect the bones. HOME CARE INSTRUCTIONS   Follow your prescribed diet.   Take medicines only as directed by your health care provider. Do not take any new medicines (prescription, over-the-counter, or nutritional supplements) unless approved by your health care provider. Many medicines can worsen your kidney damage or need to have the dose adjusted.   Quit smoking if you smoke. Talk to your health care provider about a smoking cessation program.   Keep all follow-up visits as directed by your health care provider. SEEK IMMEDIATE MEDICAL CARE IF:  Your symptoms get worse or you develop new symptoms.   You develop symptoms of end-stage kidney disease. These include:   Headaches.   Abnormally dark or light skin.   Numbness in the hands or feet.   Easy bruising.   Frequent hiccups.   Menstruation stops.   You have a fever.   You have decreased urine production.   You havepain or bleeding when urinating. MAKE SURE YOU:  Understand these instructions.  Will watch your condition.  Will get help right away if you are not doing well or get worse. FOR MORE INFORMATION   American Association of Kidney Patients: BombTimer.gl  National Kidney Foundation: www.kidney.Star: https://mathis.com/  [facility]: www.lifeoptions.org and www.kidneyschool.org Document Released: 04/14/2008 Document Revised: 11/20/2013 Document Reviewed: 03/04/2012 [facility] Patient Information 2015 [place], [place]. This information is not intended to replace advice given to you by your health care provider. Make sure you discuss any questions you have with your health care provider. Peripheral Vascular Disease  Peripheral vascular disease (PVD) is caused by cholesterol buildup in the arteries. The arteries become  narrow or clogged. This makes it hard for blood to flow. It happens most in the legs, but it can occur in other areas of your body. HOME CARE   Quit smoking, if you smoke.  Exercise as told by your doctor.  Follow a low-fat, low-cholesterol diet as told by your doctor.  Control your diabetes, if you have diabetes.  Care for your feet to prevent infection.  Only take medicine as told by your doctor. GET HELP RIGHT AWAY IF:   You have pain or lose feeling (numbness) in your arms or legs.  Your arms or legs turn cold or blue.  You have redness, warmth, and puffiness (swelling) in your arms or legs. MAKE SURE YOU:   Understand these instructions.  Will watch your condition.  Will get help right away if you are not doing well or get worse. Document Released: 09/30/2009 Document Revised: 09/28/2011 Document Reviewed: 09/30/2009 [facility] Patient Information 2015 [place], [place]. This information is not intended to replace advice given to you by your health care provider. Make sure you discuss any questions you have with your health care provider.

## 2014-12-26 NOTE — Progress Notes (Signed)
BP 150/84 mmHg  Pulse 87  Temp(Src) 97.3 F (36.3 C)  Ht 5' 7.6" (1.717 m)  Wt 147 lb 12.8 oz (67.042 kg)  BMI 22.74 kg/m2  SpO2 94%   Subjective:    Patient ID: Chad Banks, male    DOB: 1940-12-06, 74 y.o.   MRN: [id number]  HPI: Chad Banks is a 74 y.o. male presenting on 12/26/2014 for Hypertension  HYPERTENSION / HYPERLIPIDEMIA Satisfied with current treatment? no Duration of hypertension: chronic BP monitoring frequency: not checking BP medication side effects: no Past BP meds: atenolol, amlodipine, lisinopril Duration of hyperlipidemia: chronic Cholesterol medication side effects: no Cholesterol supplements: none Past cholesterol medications: none Medication compliance: excellent compliance Aspirin: yes Recent stressors: no Recurrent headaches: no Visual changes: yes Palpitations: no Dyspnea: no Chest pain: no Lower extremity edema: no Dizzy/lightheaded: no  CHRONIC KIDNEY DISEASE CKD status: stable Medications renally dose: no Previous renal evaluation: no Pneumovax:  Up to Date Influenza Vaccine:  Up to Date   Has been having pain in his legs when he is walking, better after sitting for a little bit and massaging his legs a little bit. He has stopped smoking, but notices that it has gotten worse since then. He is otherwise feeling well with no other concerns or complaints at this time.   Relevant past medical, surgical, family and social history reviewed and updated as indicated. Interim medical history since our last visit reviewed. Allergies and medications reviewed and updated.  No current outpatient prescriptions on file prior to visit.   No current facility-administered medications on file prior to visit.    Review of Systems  Constitutional: Negative.   Eyes:       Needs to see his eye doctor for new glasses.   Respiratory: Negative.   Cardiovascular: Negative.   Neurological: Negative.   Psychiatric/Behavioral: Negative.     Per HPI  unless specifically indicated above     Objective:    BP 150/84 mmHg  Pulse 87  Temp(Src) 97.3 F (36.3 C)  Ht 5' 7.6" (1.717 m)  Wt 147 lb 12.8 oz (67.042 kg)  BMI 22.74 kg/m2  SpO2 94%  Wt Readings from Last 3 Encounters:  12/26/14 147 lb 12.8 oz (67.042 kg)  06/04/14 148 lb (67.132 kg)    Physical Exam  Constitutional: He appears well-developed and well-nourished.  HENT:  Head: Normocephalic and atraumatic.  Eyes: EOM are normal. Pupils are equal, round, and reactive to light.  Corneal scarring on R eye  Cardiovascular: Normal rate, regular rhythm, normal heart sounds and intact distal pulses.  Exam reveals no gallop and no friction rub.   No murmur heard. Pulmonary/Chest: Effort normal and breath sounds normal. No respiratory distress. He has no wheezes. He has no rales. He exhibits no tenderness.  Musculoskeletal: He exhibits no edema.  Skin: Skin is warm and dry.  Psychiatric: He has a normal mood and affect. His behavior is normal.  Nursing note and vitals reviewed.      Assessment & Plan:   Problem List Items Addressed This Visit    Hypertension - Primary    Not under good control. Better on recheck. Will increase his clonidine to 0.2mg  daily and recheck in 1 month. CMP and microalbumin checked again today. Continue to monitor.       Relevant Medications   atorvastatin (LIPITOR) 40 MG tablet   cloNIDine (CATAPRES) 0.1 MG tablet   aspirin EC 81 MG tablet   Other Relevant Orders   Microalbumin /  creatinine urine ratio   Comprehensive metabolic panel   Chronic kidney disease    BP not under great control at this time. Will increase medicine to 0.2mg  daily and recheck in 1 month. CMP and microalbumin checked today. Continue to monitor.       Alcoholic dementia    More alert and oriented today. Continue to monitor. MMSE next visit.        Other Visit Diagnoses    Hyperlipidemia        Relevant Medications    atorvastatin (LIPITOR) 40 MG tablet     cloNIDine (CATAPRES) 0.1 MG tablet    aspirin EC 81 MG tablet    Other Relevant Orders    Comprehensive metabolic panel    Lipid Panel w/o Chol/HDL Ratio    Claudication        Good pulses. Will refer to vascular for further evaluation. Referral generated today. Information provided to patient today.     Relevant Orders    Ambulatory referral to Vascular Surgery        Follow up plan: Return in about 4 weeks (around 01/23/2015) for follow up BP.

## 2014-12-26 NOTE — Assessment & Plan Note (Addendum)
BP not under great control at this time. Will increase medicine to 0.2mg  daily and recheck in 1 month. CMP and microalbumin checked today. Continue to monitor.

## 2014-12-27 ENCOUNTER — Encounter: Payer: Self-pay | Admitting: Family Medicine

## 2014-12-27 LAB — LIPID PANEL W/O CHOL/HDL RATIO
CHOLESTEROL TOTAL: 159 mg/dL (ref 100–199)
HDL: 78 mg/dL (ref >39)
LDL Calculated: 62 mg/dL (ref 0–99)
TRIGLYCERIDES: 95 mg/dL (ref 0–149)
VLDL Cholesterol Cal: 19 mg/dL (ref 5–40)

## 2014-12-27 LAB — COMPREHENSIVE METABOLIC PANEL
ALT: 27 IU/L (ref 0–44)
AST: 27 IU/L (ref 0–40)
Albumin/Globulin Ratio: 1.7 (ref 1.1–2.5)
Albumin: 4.7 g/dL (ref 3.5–4.8)
Alkaline Phosphatase: 70 IU/L (ref 39–117)
BUN/Creatinine Ratio: 13 (ref 10–22)
BUN: 16 mg/dL (ref 8–27)
Bilirubin Total: 0.7 mg/dL (ref 0.0–1.2)
CHLORIDE: 102 mmol/L (ref 97–108)
CO2: 22 mmol/L (ref 18–29)
CREATININE: 1.21 mg/dL (ref 0.76–1.27)
Calcium: 9.6 mg/dL (ref 8.6–10.2)
GFR calc non Af Amer: 59 mL/min/{1.73_m2} — ABNORMAL LOW (ref >59)
GFR, EST AFRICAN AMERICAN: 69 mL/min/{1.73_m2} (ref >59)
Globulin, Total: 2.8 g/dL (ref 1.5–4.5)
Glucose: 111 mg/dL — ABNORMAL HIGH (ref 65–99)
POTASSIUM: 4.3 mmol/L (ref 3.5–5.2)
Sodium: 143 mmol/L (ref 134–144)
TOTAL PROTEIN: 7.5 g/dL (ref 6.0–8.5)

## 2014-12-27 LAB — MICROALBUMIN / CREATININE URINE RATIO
Creatinine, Urine: 147 mg/dL
MICROALB/CREAT RATIO: 10.3 mg/g creat (ref 0.0–30.0)
MICROALBUM., U, RANDOM: 15.1 ug/mL

## 2017-01-11 ENCOUNTER — Emergency Department: Payer: Medicare Other

## 2017-01-11 ENCOUNTER — Emergency Department
Admission: EM | Admit: 2017-01-11 | Discharge: 2017-01-11 | Disposition: A | Discharge status: Home / Self Care | Payer: Medicare Other | Attending: Emergency Medicine | Admitting: Emergency Medicine

## 2017-01-11 DIAGNOSIS — Z87891 Personal history of nicotine dependence: Secondary | ICD-10-CM | POA: Diagnosis not present

## 2017-01-11 DIAGNOSIS — L0291 Cutaneous abscess, unspecified: Secondary | ICD-10-CM | POA: Insufficient documentation

## 2017-01-11 DIAGNOSIS — R222 Localized swelling, mass and lump, trunk: Secondary | ICD-10-CM | POA: Diagnosis present

## 2017-01-11 DIAGNOSIS — N189 Chronic kidney disease, unspecified: Secondary | ICD-10-CM | POA: Diagnosis not present

## 2017-01-11 DIAGNOSIS — I129 Hypertensive chronic kidney disease with stage 1 through stage 4 chronic kidney disease, or unspecified chronic kidney disease: Secondary | ICD-10-CM | POA: Insufficient documentation

## 2017-01-11 DIAGNOSIS — J449 Chronic obstructive pulmonary disease, unspecified: Secondary | ICD-10-CM | POA: Insufficient documentation

## 2017-01-11 DIAGNOSIS — L03319 Cellulitis of trunk, unspecified: Secondary | ICD-10-CM | POA: Diagnosis not present

## 2017-01-11 DIAGNOSIS — Z7982 Long term (current) use of aspirin: Secondary | ICD-10-CM | POA: Diagnosis not present

## 2017-01-11 DIAGNOSIS — Z79899 Other long term (current) drug therapy: Secondary | ICD-10-CM | POA: Diagnosis not present

## 2017-01-11 LAB — COMPREHENSIVE METABOLIC PANEL
ALT: 13 U/L — ABNORMAL LOW (ref 17–63)
ANION GAP: 12 (ref 5–15)
AST: 19 U/L (ref 15–41)
Albumin: 4.1 g/dL (ref 3.5–5.0)
Alkaline Phosphatase: 58 U/L (ref 38–126)
BUN: 58 mg/dL — ABNORMAL HIGH (ref 6–20)
CO2: 28 mmol/L (ref 22–32)
Calcium: 10.2 mg/dL (ref 8.9–10.3)
Chloride: 107 mmol/L (ref 101–111)
Creatinine, Ser: 2.09 mg/dL — ABNORMAL HIGH (ref 0.61–1.24)
GFR, EST AFRICAN AMERICAN: 34 mL/min — AB (ref >60)
GFR, EST NON AFRICAN AMERICAN: 30 mL/min — AB (ref >60)
Glucose, Bld: 134 mg/dL — ABNORMAL HIGH (ref 65–99)
POTASSIUM: 3.7 mmol/L (ref 3.5–5.1)
Sodium: 147 mmol/L — ABNORMAL HIGH (ref 135–145)
TOTAL PROTEIN: 9.3 g/dL — AB (ref 6.5–8.1)
Total Bilirubin: 1 mg/dL (ref 0.3–1.2)

## 2017-01-11 LAB — CBC WITH DIFFERENTIAL/PLATELET
BASOS ABS: 0 10*3/uL (ref 0–0.1)
Basophils Relative: 0 %
EOS PCT: 0 %
Eosinophils Absolute: 0 10*3/uL (ref 0–0.7)
HEMATOCRIT: 42 % (ref 40.0–52.0)
Hemoglobin: 14.5 g/dL (ref 13.0–18.0)
LYMPHS PCT: 14 %
Lymphs Abs: 1.5 10*3/uL (ref 1.0–3.6)
MCH: 29.8 pg (ref 26.0–34.0)
MCHC: 34.5 g/dL (ref 32.0–36.0)
MCV: 86.2 fL (ref 80.0–100.0)
Monocytes Absolute: 1.3 10*3/uL — ABNORMAL HIGH (ref 0.2–1.0)
Monocytes Relative: 12 %
NEUTROS ABS: 8 10*3/uL — AB (ref 1.4–6.5)
Neutrophils Relative %: 74 %
Platelets: 353 10*3/uL (ref 150–440)
RBC: 4.87 MIL/uL (ref 4.40–5.90)
RDW: 14.1 % (ref 11.5–14.5)
WBC: 10.8 10*3/uL — AB (ref 3.8–10.6)

## 2017-01-11 LAB — LACTIC ACID, PLASMA: Lactic Acid, Venous: 1.7 mmol/L (ref 0.5–1.9)

## 2017-01-11 MED ORDER — LIDOCAINE-EPINEPHRINE 1 %-1:100000 IJ SOLN
10.0000 mL | Freq: Once | INTRAMUSCULAR | Status: AC
  Administered 2017-01-11: 10 mL via INTRADERMAL
  Filled 2017-01-11: qty 10

## 2017-01-11 MED ORDER — CEPHALEXIN 500 MG PO CAPS: 500.0000 mg | ORAL_CAPSULE | Freq: Three times a day (TID) | ORAL | 0 refills | Status: AC

## 2017-01-11 MED ORDER — IOPAMIDOL (ISOVUE-300) INJECTION 61%
60.0000 mL | Freq: Once | INTRAVENOUS | Status: AC | PRN
  Administered 2017-01-11: 60 mL via INTRAVENOUS

## 2017-01-11 MED ORDER — CEPHALEXIN 500 MG PO CAPS
500.0000 mg | ORAL_CAPSULE | Freq: Once | ORAL | Status: AC
  Administered 2017-01-11: 500 mg via ORAL
  Filled 2017-01-11: qty 1

## 2017-01-11 MED ORDER — SULFAMETHOXAZOLE-TRIMETHOPRIM 800-160 MG PO TABS
1.0000 | ORAL_TABLET | Freq: Once | ORAL | Status: AC
  Administered 2017-01-11: 1 via ORAL
  Filled 2017-01-11: qty 1

## 2017-01-11 MED ORDER — FENTANYL CITRATE (PF) 100 MCG/2ML IJ SOLN
50.0000 ug | Freq: Once | INTRAMUSCULAR | Status: AC
  Administered 2017-01-11: 50 ug via INTRAVENOUS
  Filled 2017-01-11: qty 2

## 2017-01-11 MED ORDER — SULFAMETHOXAZOLE-TRIMETHOPRIM 800-160 MG PO TABS: 1.0000 | ORAL_TABLET | Freq: Two times a day (BID) | ORAL | 0 refills | Status: AC

## 2017-01-11 MED ORDER — HYDROCODONE-ACETAMINOPHEN 5-325 MG PO TABS: 1.0000 | ORAL_TABLET | Freq: Four times a day (QID) | ORAL | 0 refills | Status: DC | PRN

## 2017-01-11 MED ORDER — SODIUM CHLORIDE 0.9 % IV BOLUS (SEPSIS)
1000.0000 mL | Freq: Once | INTRAVENOUS | Status: AC
  Administered 2017-01-11: 1000 mL via INTRAVENOUS

## 2017-01-11 NOTE — Discharge Instructions (Addendum)
Please take all of your antibiotics as prescribed. It is critically important that a doctor sees your back in 2 days. Either follow up with her primary care physician, or if you are unable to see her doctor please return to our emergency department for reevaluation.  It was a pleasure to take care of you today, and thank you for coming to our emergency department.  If you have any questions or concerns before leaving please ask the nurse to grab me and I'm more than happy to go through your aftercare instructions again.  If you were prescribed any opioid pain medication today such as Norco, Vicodin, Percocet, morphine, hydrocodone, or oxycodone please make sure you do not drive when you are taking this medication as it can alter your ability to drive safely.  If you have any concerns once you are home that you are not improving or are in fact getting worse before you can make it to your follow-up appointment, please do not hesitate to call [phone] and come back for further evaluation.  Darel Hong MD  Results for orders placed or performed during the hospital encounter of 01/11/17  Lactic acid, plasma  Result Value Ref Range   Lactic Acid, Venous 1.7 0.5 - 1.9 mmol/L  Comprehensive metabolic panel  Result Value Ref Range   Sodium 147 (H) 135 - 145 mmol/L   Potassium 3.7 3.5 - 5.1 mmol/L   Chloride 107 101 - 111 mmol/L   CO2 28 22 - 32 mmol/L   Glucose, Bld 134 (H) 65 - 99 mg/dL   BUN 58 (H) 6 - 20 mg/dL   Creatinine, Ser 2.09 (H) 0.61 - 1.24 mg/dL   Calcium 10.2 8.9 - 10.3 mg/dL   Total Protein 9.3 (H) 6.5 - 8.1 g/dL   Albumin 4.1 3.5 - 5.0 g/dL   AST 19 15 - 41 U/L   ALT 13 (L) 17 - 63 U/L   Alkaline Phosphatase 58 38 - 126 U/L   Total Bilirubin 1.0 0.3 - 1.2 mg/dL   GFR calc non Af Amer 30 (L) >60 mL/min   GFR calc Af Amer 34 (L) >60 mL/min   Anion gap 12 5 - 15  CBC with Differential  Result Value Ref Range   WBC 10.8 (H) 3.8 - 10.6 K/uL   RBC 4.87 4.40 - 5.90 MIL/uL   Hemoglobin 14.5 13.0 - 18.0 g/dL   HCT 42.0 40.0 - 52.0 %   MCV 86.2 80.0 - 100.0 fL   MCH 29.8 26.0 - 34.0 pg   MCHC 34.5 32.0 - 36.0 g/dL   RDW 14.1 11.5 - 14.5 %   Platelets 353 150 - 440 K/uL   Neutrophils Relative % 74 %   Neutro Abs 8.0 (H) 1.4 - 6.5 K/uL   Lymphocytes Relative 14 %   Lymphs Abs 1.5 1.0 - 3.6 K/uL   Monocytes Relative 12 %   Monocytes Absolute 1.3 (H) 0.2 - 1.0 K/uL   Eosinophils Relative 0 %   Eosinophils Absolute 0.0 0 - 0.7 K/uL   Basophils Relative 0 %   Basophils Absolute 0.0 0 - 0.1 K/uL   Ct Chest W Contrast  Result Date: 01/11/2017 CLINICAL DATA:  Pt has open swollen draining wound to upper right back. States has been there 3 weeks. States hurts to lay on. EXAM: CT CHEST WITH CONTRAST TECHNIQUE: Multidetector CT imaging of the chest was performed during intravenous contrast administration. CONTRAST:  58mL ISOVUE-300 IOPAMIDOL (ISOVUE-300) INJECTION 61% COMPARISON:  None. FINDINGS:  Cardiovascular: No significant vascular findings. Normal heart size. No pericardial effusion. Normal caliber thoracic aorta. Coronary artery atherosclerosis in the LAD, circumflex and RCA. Mediastinum/Nodes: No enlarged mediastinal, hilar, or axillary lymph nodes. Thyroid gland, trachea, and esophagus demonstrate no significant findings. Lungs/Pleura: Lungs are clear. No pleural effusion or pneumothorax. Bilateral centrilobular emphysema. Upper Abdomen: No acute upper abdominal abnormality. Left renal cortical scarring. Bilateral small renal cysts. Musculoskeletal: No acute osseous abnormality. No lytic or sclerotic osseous lesion. 1.4 x 5.2 x 3.4 cm complex focal fluid collection in the right posterior chest wall overlying the scapula with surrounding skin thickening and inflammatory changes in the adjacent subcutaneous fat. Overall appearance is concerning for an abscess. IMPRESSION: 1. 1.4 x 5.2 x 3.4 cm complex fluid collection in the subcutaneous fat of the right posterior chest wall  overlying the scapula most concerning for an abscess with surrounding inflammatory changes and skin thickening. 2. Aortic Atherosclerosis (ICD10-I70.0). Electronically Signed   By: Kathreen Devoid   On: 01/11/2017 14:17

## 2017-01-11 NOTE — Progress Notes (Signed)
CSW contacted by Patient's RN and informed that Patient is unable to discharge home in taxi due to safety and reports that they are unable to locate family. Patient is mobile. CSW has contacted [facility] Department to go to address on file to see if any family is present that could pick Patient up. Per RN, Patient does not have a medical need to utilize ambulance services. CSW is awaiting return phone call from [facility] Department.   Lorrine Kin, MSW, [place] Clinical Social Worker [phone]

## 2017-01-11 NOTE — ED Notes (Signed)
Talked to [place] booker, [phone]. Ms. Gertie Exon said that she would call someone to pick-up pt. She will call [phone] if there is a problem.

## 2017-01-11 NOTE — ED Provider Notes (Signed)
[facility] Emergency Department Provider Note  ____________________________________________   First MD Initiated Contact with Patient 01/11/17 1313     (approximate)  I have reviewed the triage vital signs and the nursing notes.   HISTORY  Chief Complaint Wound Infection    HPI Chad Banks is a 76 y.o. male comes to the emergency Department with roughly 1 week of painful swelling to his upper right back. He says he's not sure exactly how long its been there but today began to smell and it is bothering him. He has a past medical history of hypertension and dyslipidemia and reports intermittent compliance with his medications. He denies fevers or chills. He denies chest pain or shortness of breath. He denies abdominal pain nausea or vomiting.   Past Medical History:  Diagnosis Date  . 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 Chronic kidney disease   . COPD (chronic obstructive pulmonary disease) ([place])   . Eye injuries    multiple, Under care of Opthamology, Right Eye  . Hypertension   . Metabolic encephalopathy   . Substance abuse    alcohol, polysubstance    Patient Active Problem List   Diagnosis Date Noted  . Benign hypertensive renal disease   . Chronic kidney disease   . 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 No pertinent surgical history.  Prior to Admission medications   Medication Sig Start Date End Date Taking? Authorizing Provider  aspirin EC 81 MG tablet Take 1 tablet (81 mg total) by mouth daily. Patient not taking: Reported on 01/11/2017 12/26/14   Park Liter P, DO  atorvastatin (LIPITOR) 40 MG tablet Take 1 tablet (40 mg total) by mouth daily. Patient not taking: Reported on 01/11/2017 12/26/14   Park Liter P, DO  cephALEXin (KEFLEX) 500 MG capsule Take 1 capsule (500 mg total) by mouth 3 (three) times daily. 01/11/17 01/21/17  Darel Hong, MD  cloNIDine (CATAPRES) 0.1 MG tablet Take 2 tablets (0.2 mg total) by mouth  daily. Patient not taking: Reported on 01/11/2017 12/26/14   Park Liter P, DO  HYDROcodone-acetaminophen (NORCO) 5-325 MG tablet Take 1 tablet by mouth every 6 (six) hours as needed for severe pain. 01/11/17   Darel Hong, MD  sulfamethoxazole-trimethoprim (BACTRIM DS,SEPTRA DS) 800-160 MG tablet Take 1 tablet by mouth 2 (two) times daily. 01/11/17 01/21/17  Darel Hong, MD    Allergies Lisinopril  Family History  Problem Relation Age of Onset  . Arthritis Mother   . Hypertension Sister   . Alcohol abuse Brother   . Asthma Brother   . Birth defects Brother     Social History Social History  Substance Use Topics  . Smoking status: Former Smoker    Quit date: 07/20/2014  . Smokeless tobacco: Not on file  . Alcohol use 16.8 oz/week    28 Cans of beer per week    Review of Systems onstitutional: No fever/chills Eyes: No visual changes. ENT: No sore throat. Cardiovascular: Denies chest pain. Respiratory: Denies shortness of breath. Gastrointestinal: No abdominal pain.  No nausea, no vomiting.  No diarrhea.  No constipation. Genitourinary: Negative for dysuria. Musculoskeletal: Positive for back pain. Skin: Positive for wound Neurological: Negative for headaches, focal weakness or numbness.   ____________________________________________   PHYSICAL EXAM:  VITAL SIGNS: ED Triage Vitals  Enc Vitals Group     BP 01/11/17 1122 120/87     Pulse Rate 01/11/17 1122 64     Resp 01/11/17 1122 18  Temp 01/11/17 1122 97.7 F (36.5 C)     Temp Source 01/11/17 1122 Oral     SpO2 01/11/17 1122 94 %     Weight 01/11/17 1123 147 lb (66.7 kg)     Height 01/11/17 1123 5\' 5"  (1.651 m)     Head Circumference --      Peak Flow --      Pain Score 01/11/17 1127 8     Pain Loc --      Pain Edu? --      Excl. in [place]? --     Constitutional: Pleasant cooperative laughing joking. Alert and oriented 3 to name person and location confused about the year Eyes: PERRL EOMI. Head:  Atraumatic. Nose: No congestion/rhinnorhea. Mouth/Throat: No trismus Neck: No stridor.   Cardiovascular: Normal rate, regular rhythm. Grossly normal heart sounds.  Good peripheral circulation. Respiratory: Normal respiratory effort.  No retractions. Lungs CTAB and moving good air Gastrointestinal: Soft nontender Musculoskeletal: No lower extremity edema   Neurologic:  . No gross focal neurologic deficits are appreciated. Skin:  5 cm x 3 cm abscess to right upper back with necrotic core beginning to express purulent material overlying cellulitis Psychiatric: Some signs of dementia    ____________________________________________   DIFFERENTIAL includes but not limited to  Abscess, cellulitis, sepsis, osteomyelitis ____________________________________________   LABS (all labs ordered are listed, but only abnormal results are displayed)  Labs Reviewed  COMPREHENSIVE METABOLIC PANEL - Abnormal; Notable for the following:       Result Value   Sodium 147 (*)    Glucose, Bld 134 (*)    BUN 58 (*)    Creatinine, Ser 2.09 (*)    Total Protein 9.3 (*)    ALT 13 (*)    GFR calc non Af Amer 30 (*)    GFR calc Af Amer 34 (*)    All other components within normal limits  CBC WITH DIFFERENTIAL/PLATELET - Abnormal; Notable for the following:    WBC 10.8 (*)    Neutro Abs 8.0 (*)    Monocytes Absolute 1.3 (*)    All other components within normal limits  LACTIC ACID, PLASMA  LACTIC ACID, PLASMA  URINALYSIS, COMPLETE (UACMP) WITH MICROSCOPIC    Slightly elevated white count is nonspecific Slight elevation in creatinine __________________________________________  EKG  ____________________________________________  RADIOLOGY  CT scan confirms superficial abscess no evidence last 2 myelitis ____________________________________________   PROCEDURES  Procedure(s) performed: yes  INCISION AND DRAINAGE Performed by: Darel Hong Consent: Verbal consent obtained. Risks and  benefits: risks, benefits and alternatives were discussed Type: abscess  Body area: Right upper back  Anesthesia: local infiltration  Incision was made with a scalpel.  Local anesthetic: lidocaine 1% with epinephrine  Anesthetic total: 6 ml  Complexity: complex Blunt dissection to break up loculations  Drainage: purulent  Drainage amount: 6 cc    Patient tolerance: Patient tolerated the procedure well with no immediate complications.     Procedures  Critical Care performed: no  Observation: no ____________________________________________   INITIAL IMPRESSION / ASSESSMENT AND PLAN / ED COURSE  Pertinent labs & imaging results that were available during my care of the patient were reviewed by me and considered in my medical decision making (see chart for details).  The patient arrives hemodynamically stable and well appearing. He does clearly have dementia but knows where he is and why he is here. He is able to clearly give his home address. The abscess is large to his right upper back/obtained  a CT scan to evaluate for deep involvement and possible osteomyelitis. Fortunately the patient's CT scan confirms that the abscess is superficial. I then verbally consented the patient for incision and drainage and performed incision and drainage with a total of 6 cc 1% lidocaine with epinephrine expressing roughly 6-7 cc of purulent material. All loculations broken up. Given the slight overlying cellulitis I will cover him with Bactrim and Keflex and a 2 day wound check. He understands to return to our emergency Department should he not be able to get into see his primary care physician.      ____________________________________________   FINAL CLINICAL IMPRESSION(S) / ED DIAGNOSES  Final diagnoses:  Cellulitis of trunk, unspecified site of trunk  Abscess      NEW MEDICATIONS STARTED DURING THIS VISIT:  New Prescriptions   CEPHALEXIN (KEFLEX) 500 MG CAPSULE    Take 1  capsule (500 mg total) by mouth 3 (three) times daily.   HYDROCODONE-ACETAMINOPHEN (NORCO) 5-325 MG TABLET    Take 1 tablet by mouth every 6 (six) hours as needed for severe pain.   SULFAMETHOXAZOLE-TRIMETHOPRIM (BACTRIM DS,SEPTRA DS) 800-160 MG TABLET    Take 1 tablet by mouth 2 (two) times daily.     Note:  This document was prepared using Dragon voice recognition software and may include unintentional dictation errors.     Darel Hong, MD 01/11/17 [phone]

## 2017-01-11 NOTE — ED Notes (Signed)
Pt with right scapula wound of unknown age. Area is raised, firm borders, bleeding slightly. Pt unsure what caused wd.

## 2017-01-11 NOTE — ED Triage Notes (Signed)
Pt arrives via [facility]. State sometimes he is from [place] and some times from [place]. Pt does not appear to answer questions well, poor historian. Pt has open swollen draining wound to upper R back. States has been there 3 weeks. States hurts to lay on. Pt main complaint is that he is hungry. Pt is alert but not oriented. Knows name, DOB, knows he's in [place] but not where. Doesn't know year. Denies DM. Denies taking antibiotics for wound.

## 2017-01-11 NOTE — ED Notes (Signed)
First Nurse: pt arrives to ed via [facility] from home with reports of large infected area to upper back. Ems states all VSS.

## 2017-01-11 NOTE — ED Notes (Addendum)
pts niece Morene Crocker to bedside and agreed to take pt home. Discharge papers reviewed with ms brown and pt. Pt unable to sign

## 2017-01-13 ENCOUNTER — Emergency Department
Admission: EM | Admit: 2017-01-13 | Discharge: 2017-01-13 | Disposition: A | Discharge status: Home / Self Care | Payer: Medicare Other | Attending: Student in an Organized Health Care Education/Training Program | Admitting: Student in an Organized Health Care Education/Training Program

## 2017-01-13 ENCOUNTER — Encounter: Payer: Self-pay | Admitting: *Deleted

## 2017-01-13 DIAGNOSIS — Z87891 Personal history of nicotine dependence: Secondary | ICD-10-CM | POA: Insufficient documentation

## 2017-01-13 DIAGNOSIS — J449 Chronic obstructive pulmonary disease, unspecified: Secondary | ICD-10-CM | POA: Insufficient documentation

## 2017-01-13 DIAGNOSIS — I129 Hypertensive chronic kidney disease with stage 1 through stage 4 chronic kidney disease, or unspecified chronic kidney disease: Secondary | ICD-10-CM | POA: Insufficient documentation

## 2017-01-13 DIAGNOSIS — Z4801 Encounter for change or removal of surgical wound dressing: Secondary | ICD-10-CM | POA: Diagnosis present

## 2017-01-13 DIAGNOSIS — Z09 Encounter for follow-up examination after completed treatment for conditions other than malignant neoplasm: Secondary | ICD-10-CM

## 2017-01-13 DIAGNOSIS — N189 Chronic kidney disease, unspecified: Secondary | ICD-10-CM | POA: Diagnosis not present

## 2017-01-13 NOTE — ED Notes (Signed)
Family at bedside. 

## 2017-01-13 NOTE — ED Provider Notes (Signed)
[facility] Emergency Department Provider Note   ____________________________________________   First MD Initiated Contact with Patient 01/13/17 1227     (approximate)  I have reviewed the triage vital signs and the nursing notes.   HISTORY  Chief Complaint Wound Check    HPI Chad Banks is a 76 y.o. male patient presents today for wound check secondary to having a abscess drained from the right scapular area 2 days ago. Patient denies pain. Patient state crepitus amount of drainage. Patient currently taking 2 antibiotics as directed.   Past Medical History:  Diagnosis Date  . 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 Chronic kidney disease   . COPD (chronic obstructive pulmonary disease) ([place])   . Eye injuries    multiple, Under care of Opthamology, Right Eye  . Hypertension   . Metabolic encephalopathy   . Substance abuse    alcohol, polysubstance    Patient Active Problem List   Diagnosis Date Noted  . Benign hypertensive renal disease   . Chronic kidney disease   . 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 No pertinent surgical history.  Prior to Admission medications   Medication Sig Start Date End Date Taking? Authorizing Provider  aspirin EC 81 MG tablet Take 1 tablet (81 mg total) by mouth daily. Patient not taking: Reported on 01/11/2017 12/26/14   Park Liter P, DO  atorvastatin (LIPITOR) 40 MG tablet Take 1 tablet (40 mg total) by mouth daily. Patient not taking: Reported on 01/11/2017 12/26/14   Park Liter P, DO  cephALEXin (KEFLEX) 500 MG capsule Take 1 capsule (500 mg total) by mouth 3 (three) times daily. 01/11/17 01/21/17  Darel Hong, MD  cloNIDine (CATAPRES) 0.1 MG tablet Take 2 tablets (0.2 mg total) by mouth daily. Patient not taking: Reported on 01/11/2017 12/26/14   Park Liter P, DO  HYDROcodone-acetaminophen (NORCO) 5-325 MG tablet Take 1 tablet by mouth every 6 (six) hours as needed for severe pain. 01/11/17    Darel Hong, MD  sulfamethoxazole-trimethoprim (BACTRIM DS,SEPTRA DS) 800-160 MG tablet Take 1 tablet by mouth 2 (two) times daily. 01/11/17 01/21/17  Darel Hong, MD    Allergies Lisinopril  Family History  Problem Relation Age of Onset  . Arthritis Mother   . Hypertension Sister   . Alcohol abuse Brother   . Asthma Brother   . Birth defects Brother     Social History Social History  Substance Use Topics  . Smoking status: Former Smoker    Quit date: 07/20/2014  . Smokeless tobacco: Not on file  . Alcohol use 16.8 oz/week    28 Cans of beer per week    Review of Systems  Constitutional: No fever/chills Eyes: No visual changes. ENT: No sore throat. Cardiovascular: Denies chest pain. Respiratory: Denies shortness of breath. Gastrointestinal: No abdominal pain.  No nausea, no vomiting.  No diarrhea.  No constipation. Genitourinary: Negative for dysuria. Musculoskeletal: Negative for back pain. Skin: Negative for rash. Neurological: Negative for headaches, focal weakness or numbness. {**Psychiatric: Alcoholic dementia substance abuse. Endocrine:None kidney disease and hypertension. Hematological/Lymphatic: Allergic/Immunilogical: A centimeter throat ____________________________________________   PHYSICAL EXAM:  VITAL SIGNS: ED Triage Vitals [01/13/17 1103]  Enc Vitals Group     BP 129/79     Pulse Rate 84     Resp 16     Temp 98.6 F (37 C)     Temp Source Oral     SpO2 98 %     Weight 147 lb (  66.7 kg)     Height 5\' 5"  (1.651 m)     Head Circumference      Peak Flow      Pain Score 0     Pain Loc      Pain Edu?      Excl. in [place]?     Constitutional: Alert and oriented. Well appearing and in no acute distress. Cardiovascular: Normal rate, regular rhythm. Grossly normal heart sounds.  Good peripheral circulation. Respiratory: Normal respiratory effort.  No retractions. Lungs CTAB. Neurologic:  Normal speech and language. No gross focal neurologic  deficits are appreciated. No gait instability. Skin:  Skin is warm, dry and intact. No rash noted. Abscess with purulent drainage right Psychiatric: Mood and affect are normal. Speech and behavior are normal.  ____________________________________________   LABS (all labs ordered are listed, but only abnormal results are displayed)  Labs Reviewed - No data to display ____________________________________________  EKG   ____________________________________________  RADIOLOGY  No results found.  ____________________________________________   PROCEDURES  Procedure(s) performed: None  Procedures  Critical Care performed: No  ____________________________________________   INITIAL IMPRESSION / ASSESSMENT AND PLAN / ED COURSE  Pertinent labs & imaging results that were available during my care of the patient were reviewed by me and considered in my medical decision making (see chart for details).  Wound check status post I&D of an abscess 2 days ago. All dressing removed and wound irrigated and packed idofoam. Area was re-bandaged. Patient given discharge care instructions and return back to 2 days for wound check.      ____________________________________________   FINAL CLINICAL IMPRESSION(S) / ED DIAGNOSES  Final diagnoses:  Encounter for recheck of abscess following incision and drainage      NEW MEDICATIONS STARTED DURING THIS VISIT:  New Prescriptions   No medications on file     Note:  This document was prepared using Dragon voice recognition software and may include unintentional dictation errors.    Sable Feil, PA-C 01/13/17 1231    Merlyn Lot, MD 01/13/17 2044

## 2017-01-13 NOTE — ED Triage Notes (Signed)
Pt is here for wound check after it was drained on Monday, wound on back

## 2017-01-16 ENCOUNTER — Encounter: Payer: Self-pay | Admitting: Emergency Medicine

## 2017-01-16 ENCOUNTER — Emergency Department
Admission: EM | Admit: 2017-01-16 | Discharge: 2017-01-16 | Disposition: A | Discharge status: Home / Self Care | Payer: Medicare Other | Attending: Emergency Medicine | Admitting: Emergency Medicine

## 2017-01-16 DIAGNOSIS — Z87891 Personal history of nicotine dependence: Secondary | ICD-10-CM | POA: Diagnosis not present

## 2017-01-16 DIAGNOSIS — L02212 Cutaneous abscess of back [any part, except buttock]: Secondary | ICD-10-CM | POA: Diagnosis not present

## 2017-01-16 DIAGNOSIS — Z5189 Encounter for other specified aftercare: Secondary | ICD-10-CM | POA: Insufficient documentation

## 2017-01-16 DIAGNOSIS — I129 Hypertensive chronic kidney disease with stage 1 through stage 4 chronic kidney disease, or unspecified chronic kidney disease: Secondary | ICD-10-CM | POA: Insufficient documentation

## 2017-01-16 DIAGNOSIS — Z79899 Other long term (current) drug therapy: Secondary | ICD-10-CM | POA: Insufficient documentation

## 2017-01-16 DIAGNOSIS — Z7982 Long term (current) use of aspirin: Secondary | ICD-10-CM | POA: Insufficient documentation

## 2017-01-16 DIAGNOSIS — J449 Chronic obstructive pulmonary disease, unspecified: Secondary | ICD-10-CM | POA: Insufficient documentation

## 2017-01-16 DIAGNOSIS — N189 Chronic kidney disease, unspecified: Secondary | ICD-10-CM | POA: Diagnosis not present

## 2017-01-16 NOTE — ED Triage Notes (Signed)
Pt presents to ED ambulatory with c/o wound check. Pt is due for a change in his packing. Pt is in NAD at this time.

## 2017-01-16 NOTE — ED Provider Notes (Signed)
[facility] Emergency Department Provider Note  ____________________________________________  Time seen: Approximately 9:31 PM  I have reviewed the triage vital signs and the nursing notes.   HISTORY  Chief Complaint Wound Check    HPI Chad Banks is a 76 y.o. male presenting to the emergency department for a wound check of the right upper scapula. Patient underwent incision and drainage on 01/11/2017 for an abscess. He has been taking his antibiotics as directed. He has had no fever or chills. He is presenting to the emergency department today for a wound check.   Past Medical History:  Diagnosis Date  . 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 Chronic kidney disease   . COPD (chronic obstructive pulmonary disease) ([place])   . Eye injuries    multiple, Under care of Opthamology, Right Eye  . Hypertension   . Metabolic encephalopathy   . Substance abuse    alcohol, polysubstance    Patient Active Problem List   Diagnosis Date Noted  . Benign hypertensive renal disease   . Chronic kidney disease   . 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 No pertinent surgical history.  Prior to Admission medications   Medication Sig Start Date End Date Taking? Authorizing Provider  aspirin EC 81 MG tablet Take 1 tablet (81 mg total) by mouth daily. Patient not taking: Reported on 01/11/2017 12/26/14   Park Liter P, DO  atorvastatin (LIPITOR) 40 MG tablet Take 1 tablet (40 mg total) by mouth daily. Patient not taking: Reported on 01/11/2017 12/26/14   Park Liter P, DO  cephALEXin (KEFLEX) 500 MG capsule Take 1 capsule (500 mg total) by mouth 3 (three) times daily. 01/11/17 01/21/17  Darel Hong, MD  cloNIDine (CATAPRES) 0.1 MG tablet Take 2 tablets (0.2 mg total) by mouth daily. Patient not taking: Reported on 01/11/2017 12/26/14   Park Liter P, DO  HYDROcodone-acetaminophen (NORCO) 5-325 MG tablet Take 1 tablet by mouth every 6 (six) hours as needed for severe  pain. 01/11/17   Darel Hong, MD  sulfamethoxazole-trimethoprim (BACTRIM DS,SEPTRA DS) 800-160 MG tablet Take 1 tablet by mouth 2 (two) times daily. 01/11/17 01/21/17  Darel Hong, MD    Allergies Lisinopril  Family History  Problem Relation Age of Onset  . Arthritis Mother   . Hypertension Sister   . Alcohol abuse Brother   . Asthma Brother   . Birth defects Brother     Social History Social History  Substance Use Topics  . Smoking status: Former Smoker    Quit date: 07/20/2014  . Smokeless tobacco: Never Used  . Alcohol use 16.8 oz/week    28 Cans of beer per week     Review of Systems  Constitutional: No fever/chills Eyes: No visual changes. No discharge ENT: No upper respiratory complaints. Cardiovascular: no chest pain. Respiratory: no cough. No SOB. Gastrointestinal: No abdominal pain.  No nausea, no vomiting.  No diarrhea.  No constipation. Musculoskeletal: Negative for musculoskeletal pain. Skin: Patient has resolving right upper back abscess.   Neurological: Negative for headaches, focal weakness or numbness.   ____________________________________________   PHYSICAL EXAM:  VITAL SIGNS: ED Triage Vitals  Enc Vitals Group     BP 01/16/17 2001 (!) 155/80     Pulse Rate 01/16/17 2001 88     Resp 01/16/17 2001 18     Temp 01/16/17 2001 97.8 F (36.6 C)     Temp Source 01/16/17 2001 Oral     SpO2 01/16/17 2001 98 %  Weight 01/16/17 2002 147 lb (66.7 kg)     Height 01/16/17 2002 5\' 5"  (1.651 m)     Head Circumference --      Peak Flow --      Pain Score 01/16/17 2001 5     Pain Loc --      Pain Edu? --      Excl. in [place]? --      Constitutional: Alert and oriented. Well appearing and in no acute distress. Eyes: Conjunctivae are normal. PERRL. EOMI. Head: Atraumatic. Cardiovascular: Normal rate, regular rhythm. Normal S1 and S2.  Good peripheral circulation. Respiratory: Normal respiratory effort without tachypnea or retractions. Lungs CTAB.  Good air entry to the bases with no decreased or absent breath sounds. Musculoskeletal: Full range of motion to all extremities. No gross deformities appreciated. Neurologic:  Normal speech and language. No gross focal neurologic deficits are appreciated.  Skin: Patient has resolving right upper back abscess. Psychiatric: Mood and affect are normal. Speech and behavior are normal. Patient exhibits appropriate insight and judgement.   ____________________________________________   LABS (all labs ordered are listed, but only abnormal results are displayed)  Labs Reviewed - No data to display ____________________________________________  EKG   ____________________________________________  RADIOLOGY   No results found.  ____________________________________________    PROCEDURES  Procedure(s) performed:    Procedures    Medications - No data to display   ____________________________________________   INITIAL IMPRESSION / ASSESSMENT AND PLAN / ED COURSE  Pertinent labs & imaging results that were available during my care of the patient were reviewed by me and considered in my medical decision making (see chart for details).  Review of the [place] CSRS was performed in accordance of the [place] prior to dispensing any controlled drugs.     Assessment and plan: Wound Check:  Patient presents to the emergency department with a resolving right upper back abscess. Patient has been taking antibiotics as directed. Packing was removed in the emergency department. Further packing is unnecessary at this time. Vital signs are reassuring prior to discharge. Patient was advised to continue basic wound care and to follow up with primary care in one week.. All patient questions were answered. ____________________________________________  FINAL CLINICAL IMPRESSION(S) / ED DIAGNOSES  Final diagnoses:  Visit for wound check      NEW MEDICATIONS STARTED DURING THIS  VISIT:  Discharge Medication List as of 01/16/2017  9:08 PM          This chart was dictated using voice recognition software/Dragon. Despite best efforts to proofread, errors can occur which can change the meaning. Any change was purely unintentional.    Karren Cobble 01/16/17 2138    Orbie Pyo, MD 01/16/17 [phone]

## 2017-04-21 ENCOUNTER — Telehealth: Payer: Self-pay | Admitting: Family Medicine

## 2017-04-21 ENCOUNTER — Ambulatory Visit (INDEPENDENT_AMBULATORY_CARE_PROVIDER_SITE_OTHER): Payer: Medicare Other | Admitting: Family Medicine

## 2017-04-21 ENCOUNTER — Encounter: Payer: Self-pay | Admitting: Family Medicine

## 2017-04-21 VITALS — BP 205/99 | HR 71 | Ht 69.0 in | Wt 138.0 lb

## 2017-04-21 DIAGNOSIS — E782 Mixed hyperlipidemia: Secondary | ICD-10-CM | POA: Diagnosis not present

## 2017-04-21 DIAGNOSIS — N183 Chronic kidney disease, stage 3 unspecified: Secondary | ICD-10-CM

## 2017-04-21 DIAGNOSIS — Z23 Encounter for immunization: Secondary | ICD-10-CM

## 2017-04-21 DIAGNOSIS — E785 Hyperlipidemia, unspecified: Secondary | ICD-10-CM | POA: Insufficient documentation

## 2017-04-21 DIAGNOSIS — R634 Abnormal weight loss: Secondary | ICD-10-CM | POA: Diagnosis not present

## 2017-04-21 DIAGNOSIS — F1027 Alcohol dependence with alcohol-induced persisting dementia: Secondary | ICD-10-CM | POA: Diagnosis not present

## 2017-04-21 DIAGNOSIS — I129 Hypertensive chronic kidney disease with stage 1 through stage 4 chronic kidney disease, or unspecified chronic kidney disease: Secondary | ICD-10-CM

## 2017-04-21 DIAGNOSIS — S81801A Unspecified open wound, right lower leg, initial encounter: Secondary | ICD-10-CM | POA: Diagnosis not present

## 2017-04-21 LAB — MICROALBUMIN, URINE WAIVED
Creatinine, Urine Waived: 100 mg/dL (ref 10–300)
Microalb, Ur Waived: 10 mg/L (ref 0–19)
Microalb/Creat Ratio: <30 mg/g (ref <30)

## 2017-04-21 LAB — UA/M W/RFLX CULTURE, ROUTINE
Bilirubin, UA: NEGATIVE
Glucose, UA: NEGATIVE
Ketones, UA: NEGATIVE
Leukocytes, UA: NEGATIVE
NITRITE UA: NEGATIVE
PH UA: 6 (ref 5.0–7.5)
Protein, UA: NEGATIVE
RBC UA: NEGATIVE
Specific Gravity, UA: 1.02 (ref 1.005–1.030)
UUROB: 0.2 mg/dL (ref 0.2–1.0)

## 2017-04-21 LAB — MICROSCOPIC EXAMINATION
BACTERIA UA: NONE SEEN
RBC MICROSCOPIC, UA: NONE SEEN /HPF (ref 0–?)
WBC UA: NONE SEEN /HPF (ref 0–?)

## 2017-04-21 LAB — BAYER DCA HB A1C WAIVED: HB A1C (BAYER DCA - WAIVED): 5.5 % (ref <7.0)

## 2017-04-21 MED ORDER — AMLODIPINE BESYLATE 10 MG PO TABS: 10.0000 mg | ORAL_TABLET | Freq: Every day | ORAL | 3 refills | Status: DC

## 2017-04-21 NOTE — Telephone Encounter (Signed)
Patient's caretaker requesting a copy of TB results be faxed to Department of Social Services.   Please Advise  Department of Social Services fax: [phone]  Please Advise.  Thank you

## 2017-04-21 NOTE — Assessment & Plan Note (Signed)
Rechecking levels today. Await results.  

## 2017-04-21 NOTE — Assessment & Plan Note (Signed)
To be moving to assisted living to help with care. FL2 form filled out today. Rechecking labs.

## 2017-04-21 NOTE — Assessment & Plan Note (Signed)
Under very poor control. Has not been taking medication for at least 2 years, forgot that he has HTN. Given rebound from clonidine and ACE-inhibitor allergy, will change to amlodipine and recheck in 1 week.

## 2017-04-21 NOTE — Progress Notes (Signed)
BP (!) 205/99   Pulse 71   Ht 5\' 9"  (1.753 m)   Wt 138 lb (62.6 kg)   SpO2 98%   BMI 20.38 kg/m    Subjective:    Patient ID: Chad Banks, male    DOB: 1940/09/05, 76 y.o.   MRN: [id number]  HPI: Chad Banks is a 76 y.o. male who presents today to have a form filled out after being lost to follow up for 2 years. He has been to the ER, but has not seen any regular doctors in 2 years.   Chief Complaint  Patient presents with  . Form   HYPERTENSION Hypertension status: uncontrolled  Satisfied with current treatment? no Duration of hypertension: chronic BP monitoring frequency:  not checking BP medication side effects:  no Medication compliance: poor compliance Previous BP meds:clonidine, lisinopril Aspirin: no Recurrent headaches: no Visual changes: yes Palpitations: no Dyspnea: no Chest pain: no Lower extremity edema: no Dizzy/lightheaded: no  Relevant past medical, surgical, family and social history reviewed and updated as indicated. Interim medical history since our last visit reviewed. Allergies and medications reviewed and updated.  Review of Systems  Constitutional: Negative.   Respiratory: Negative.   Cardiovascular: Negative.   Psychiatric/Behavioral: Negative.     Per HPI unless specifically indicated above     Objective:    BP (!) 205/99   Pulse 71   Ht 5\' 9"  (1.753 m)   Wt 138 lb (62.6 kg)   SpO2 98%   BMI 20.38 kg/m   Wt Readings from Last 3 Encounters:  04/21/17 138 lb (62.6 kg)  01/16/17 147 lb (66.7 kg)  01/13/17 147 lb (66.7 kg)    Physical Exam  Constitutional: He is oriented to person, place, and time. He appears well-developed and well-nourished. No distress.  HENT:  Head: Normocephalic and atraumatic.  Right Ear: Hearing normal.  Left Ear: Hearing normal.  Nose: Nose normal.  Eyes: Conjunctivae and lids are normal. Right eye exhibits no discharge. Left eye exhibits no discharge. No scleral icterus.  Cardiovascular:  Normal rate, regular rhythm, normal heart sounds and intact distal pulses.  Exam reveals no gallop and no friction rub.   No murmur heard. Pulmonary/Chest: Effort normal and breath sounds normal. No respiratory distress. He has no wheezes. He has no rales. He exhibits no tenderness.  Musculoskeletal: Normal range of motion.  Neurological: He is alert and oriented to person, place, and time.  Skin: Skin is warm, dry and intact. No rash noted. He is not diaphoretic. No erythema. No pallor.  Open wound R lower leg, healing well  Psychiatric: He has a normal mood and affect. His speech is normal and behavior is normal. Judgment and thought content normal. Cognition and memory are impaired.  Nursing note and vitals reviewed.   Results for orders placed or performed during the hospital encounter of 01/11/17  Lactic acid, plasma  Result Value Ref Range   Lactic Acid, Venous 1.7 0.5 - 1.9 mmol/L  Comprehensive metabolic panel  Result Value Ref Range   Sodium 147 (H) 135 - 145 mmol/L   Potassium 3.7 3.5 - 5.1 mmol/L   Chloride 107 101 - 111 mmol/L   CO2 28 22 - 32 mmol/L   Glucose, Bld 134 (H) 65 - 99 mg/dL   BUN 58 (H) 6 - 20 mg/dL   Creatinine, Ser 2.09 (H) 0.61 - 1.24 mg/dL   Calcium 10.2 8.9 - 10.3 mg/dL   Total Protein 9.3 (H) 6.5 - 8.1  g/dL   Albumin 4.1 3.5 - 5.0 g/dL   AST 19 15 - 41 U/L   ALT 13 (L) 17 - 63 U/L   Alkaline Phosphatase 58 38 - 126 U/L   Total Bilirubin 1.0 0.3 - 1.2 mg/dL   GFR calc non Af Amer 30 (L) >60 mL/min   GFR calc Af Amer 34 (L) >60 mL/min   Anion gap 12 5 - 15  CBC with Differential  Result Value Ref Range   WBC 10.8 (H) 3.8 - 10.6 K/uL   RBC 4.87 4.40 - 5.90 MIL/uL   Hemoglobin 14.5 13.0 - 18.0 g/dL   HCT 42.0 40.0 - 52.0 %   MCV 86.2 80.0 - 100.0 fL   MCH 29.8 26.0 - 34.0 pg   MCHC 34.5 32.0 - 36.0 g/dL   RDW 14.1 11.5 - 14.5 %   Platelets 353 150 - 440 K/uL   Neutrophils Relative % 74 %   Neutro Abs 8.0 (H) 1.4 - 6.5 K/uL   Lymphocytes  Relative 14 %   Lymphs Abs 1.5 1.0 - 3.6 K/uL   Monocytes Relative 12 %   Monocytes Absolute 1.3 (H) 0.2 - 1.0 K/uL   Eosinophils Relative 0 %   Eosinophils Absolute 0.0 0 - 0.7 K/uL   Basophils Relative 0 %   Basophils Absolute 0.0 0 - 0.1 K/uL      Assessment & Plan:   Problem List Items Addressed This Visit      Nervous and Auditory   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 FL2 form filled out today. Rechecking labs.       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 Has not been taking medication for at least 2 years, forgot that he has HTN. Given rebound from clonidine and ACE-inhibitor allergy, will change to amlodipine and recheck in 1 week.       Relevant Orders   CBC with Differential/Platelet   Comprehensive metabolic panel   Microalbumin, Urine Waived   UA/M w/rflx Culture, Routine   Chronic kidney disease    Rechecking levels today. Await results.       Relevant Orders   CBC with Differential/Platelet   Comprehensive metabolic panel   UA/M w/rflx Culture, Routine     Other   Hyperlipidemia    Rechecking levels today. Await results. Will restart atorvastatin pending results.       Relevant Medications   amLODipine (NORVASC) 10 MG tablet   Other Relevant Orders   Lipid Panel w/o Chol/HDL Ratio    Other Visit Diagnoses    Weight loss       Checking labs. Await results. Call with any concerns.    Relevant Orders   Bayer DCA Hb A1c Waived   CBC with Differential/Platelet   Comprehensive metabolic panel   PSA   HIV antibody   TSH   UA/M w/rflx Culture, Routine   Immunization due       Prevnar and Flu given today.   Relevant Orders   Pneumococcal conjugate vaccine 13-valent (Completed)   Flu vaccine HIGH DOSE PF (Fluzone High dose)  (Completed)   Open wound of right lower leg, initial encounter       Healing well. Due for td. Given today.   Relevant  Orders   Td : Tetanus/diphtheria >7yo Preservative  free (Completed)       Follow up plan: Return in about 1 week (around 04/28/2017) for Follow up BP.

## 2017-04-21 NOTE — Assessment & Plan Note (Signed)
Rechecking levels today. Await results. Will restart atorvastatin pending results.

## 2017-04-22 ENCOUNTER — Encounter: Payer: Self-pay | Admitting: Family Medicine

## 2017-04-22 ENCOUNTER — Telehealth: Payer: Self-pay | Admitting: Family Medicine

## 2017-04-22 LAB — LIPID PANEL W/O CHOL/HDL RATIO
Cholesterol, Total: 196 mg/dL (ref 100–199)
HDL: 91 mg/dL (ref >39)
LDL CALC: 91 mg/dL (ref 0–99)
Triglycerides: 72 mg/dL (ref 0–149)
VLDL Cholesterol Cal: 14 mg/dL (ref 5–40)

## 2017-04-22 LAB — CBC WITH DIFFERENTIAL/PLATELET
BASOS: 0 %
Basophils Absolute: 0 10*3/uL (ref 0.0–0.2)
EOS (ABSOLUTE): 0 10*3/uL (ref 0.0–0.4)
EOS: 0 %
HEMATOCRIT: 40.4 % (ref 37.5–51.0)
HEMOGLOBIN: 13.5 g/dL (ref 13.0–17.7)
IMMATURE GRANULOCYTES: 0 %
Immature Grans (Abs): 0 10*3/uL (ref 0.0–0.1)
Lymphocytes Absolute: 2.6 10*3/uL (ref 0.7–3.1)
Lymphs: 39 %
MCH: 30.2 pg (ref 26.6–33.0)
MCHC: 33.4 g/dL (ref 31.5–35.7)
MCV: 90 fL (ref 79–97)
MONOCYTES: 14 %
Monocytes Absolute: 0.9 10*3/uL (ref 0.1–0.9)
NEUTROS PCT: 47 %
Neutrophils Absolute: 3.1 10*3/uL (ref 1.4–7.0)
Platelets: 249 10*3/uL (ref 150–379)
RBC: 4.47 x10E6/uL (ref 4.14–5.80)
RDW: 13.9 % (ref 12.3–15.4)
WBC: 6.7 10*3/uL (ref 3.4–10.8)

## 2017-04-22 LAB — COMPREHENSIVE METABOLIC PANEL
ALBUMIN: 4.6 g/dL (ref 3.5–4.8)
ALT: 12 IU/L (ref 0–44)
AST: 24 IU/L (ref 0–40)
Albumin/Globulin Ratio: 1.6 (ref 1.2–2.2)
Alkaline Phosphatase: 62 IU/L (ref 39–117)
BUN / CREAT RATIO: 14 (ref 10–24)
BUN: 21 mg/dL (ref 8–27)
Bilirubin Total: 0.6 mg/dL (ref 0.0–1.2)
CALCIUM: 10.1 mg/dL (ref 8.6–10.2)
CO2: 23 mmol/L (ref 20–29)
Chloride: 102 mmol/L (ref 96–106)
Creatinine, Ser: 1.5 mg/dL — ABNORMAL HIGH (ref 0.76–1.27)
GFR, EST AFRICAN AMERICAN: 52 mL/min/{1.73_m2} — AB (ref >59)
GFR, EST NON AFRICAN AMERICAN: 45 mL/min/{1.73_m2} — AB (ref >59)
GLOBULIN, TOTAL: 2.8 g/dL (ref 1.5–4.5)
Glucose: 107 mg/dL — ABNORMAL HIGH (ref 65–99)
Potassium: 3.7 mmol/L (ref 3.5–5.2)
SODIUM: 141 mmol/L (ref 134–144)
Total Protein: 7.4 g/dL (ref 6.0–8.5)

## 2017-04-22 LAB — HIV ANTIBODY (ROUTINE TESTING W REFLEX): HIV Screen 4th Generation wRfx: NONREACTIVE

## 2017-04-22 LAB — PSA: Prostate Specific Ag, Serum: 0.5 ng/mL (ref 0.0–4.0)

## 2017-04-22 LAB — TSH: TSH: 4.72 u[IU]/mL — AB (ref 0.450–4.500)

## 2017-04-22 NOTE — Telephone Encounter (Signed)
Called pt to sched for Annual Wellness Visit with Nurse Health Advisor. C/b #  [phone]  Kathryn Brown ° °

## 2017-04-26 ENCOUNTER — Telehealth: Payer: Self-pay | Admitting: Family Medicine

## 2017-04-26 DIAGNOSIS — Z111 Encounter for screening for respiratory tuberculosis: Secondary | ICD-10-CM

## 2017-04-26 NOTE — Telephone Encounter (Signed)
Levander Campion calling to check on status of fax or call back from provider or CMA regarding TB results.  Previous message from myself regarding TB results is open.  Please Advise.  Thank you.

## 2017-04-26 NOTE — Telephone Encounter (Signed)
Not drawn, patient will need to come have it drawn.

## 2017-04-26 NOTE — Telephone Encounter (Signed)
Chad Banks notified that he will need to come back in to have this drawn.

## 2017-04-26 NOTE — Telephone Encounter (Signed)
Dr.Johnson, did you order a quantiferon gold on this patient?

## 2017-04-26 NOTE — Telephone Encounter (Signed)
I did not draw a quantiferon on him. He can come in whenever he likes to get it drawn, but he will need to be restuck. I was not informed that he needed a quantiferon.

## 2017-04-26 NOTE — Telephone Encounter (Signed)
[name] with [facility] would like someone to fax over this patients labs that include his TB test and flu shot information which was done on 10/3. They need this in order to be able to place the patient.  Thanks  Fax [phone]  Phone [phone] [name]

## 2017-04-29 ENCOUNTER — Ambulatory Visit (INDEPENDENT_AMBULATORY_CARE_PROVIDER_SITE_OTHER): Payer: Medicare Other | Admitting: Family Medicine

## 2017-04-29 ENCOUNTER — Encounter: Payer: Self-pay | Admitting: Family Medicine

## 2017-04-29 VITALS — BP 176/80 | HR 69 | Temp 97.4°F | Wt 135.4 lb

## 2017-04-29 DIAGNOSIS — I129 Hypertensive chronic kidney disease with stage 1 through stage 4 chronic kidney disease, or unspecified chronic kidney disease: Secondary | ICD-10-CM | POA: Diagnosis not present

## 2017-04-29 MED ORDER — AMLODIPINE BESYLATE 10 MG PO TABS: 10.0000 mg | ORAL_TABLET | Freq: Every day | ORAL | 3 refills | Status: DC

## 2017-04-29 MED ORDER — HYDROCHLOROTHIAZIDE 25 MG PO TABS: 25.0000 mg | ORAL_TABLET | Freq: Every day | ORAL | 3 refills | Status: DC

## 2017-04-29 NOTE — Progress Notes (Signed)
BP (!) 176/80   Pulse 69   Temp (!) 97.4 F (36.3 C)   Wt 135 lb 6.4 oz (61.4 kg)   SpO2 96%   BMI 20.00 kg/m    Subjective:    Patient ID: Chad Banks, male    DOB: 21-Jun-1941, 76 y.o.   MRN: [id number]  HPI: Chad Banks is a 76 y.o. male  Chief Complaint  Patient presents with  . Follow-up  . Hypertension  . Medication Refill    pt's caregiver states that they need a printed RX of amlodipine    HYPERTENSION- better, Taking his amlodipine. Now in assisted living.  Hypertension status: better  Satisfied with current treatment? no Duration of hypertension: chronic BP monitoring frequency:  not checking BP medication side effects:  no Medication compliance: excellent compliance Previous BP meds: amlodipine Aspirin: no Recurrent headaches: no Visual changes: no Palpitations: no Dyspnea: no Chest pain: no Lower extremity edema: no Dizzy/lightheaded: no  Relevant past medical, surgical, family and social history reviewed and updated as indicated. Interim medical history since our last visit reviewed. Allergies and medications reviewed and updated.  Review of Systems  Constitutional: Negative.   Respiratory: Negative.   Cardiovascular: Negative.   Psychiatric/Behavioral: Negative.     Per HPI unless specifically indicated above     Objective:    BP (!) 176/80   Pulse 69   Temp (!) 97.4 F (36.3 C)   Wt 135 lb 6.4 oz (61.4 kg)   SpO2 96%   BMI 20.00 kg/m   Wt Readings from Last 3 Encounters:  04/29/17 135 lb 6.4 oz (61.4 kg)  04/21/17 138 lb (62.6 kg)  01/16/17 147 lb (66.7 kg)    Physical Exam  Constitutional: He is oriented to person, place, and time. He appears well-developed and well-nourished. No distress.  HENT:  Head: Normocephalic and atraumatic.  Right Ear: Hearing normal.  Left Ear: Hearing normal.  Nose: Nose normal.  Eyes: Conjunctivae and lids are normal. Right eye exhibits no discharge. Left eye exhibits no discharge. No  scleral icterus.  Cardiovascular: Normal rate, regular rhythm, normal heart sounds and intact distal pulses.  Exam reveals no gallop and no friction rub.   No murmur heard. Pulmonary/Chest: Effort normal and breath sounds normal. No respiratory distress. He has no wheezes. He has no rales. He exhibits no tenderness.  Musculoskeletal: Normal range of motion.  Neurological: He is alert and oriented to person, place, and time.  Skin: Skin is warm, dry and intact. No rash noted. He is not diaphoretic. No erythema. No pallor.  Psychiatric: He has a normal mood and affect. His speech is normal and behavior is normal. Judgment and thought content normal. Cognition and memory are normal.  Nursing note and vitals reviewed.   Results for orders placed or performed in visit on 04/21/17  Microscopic Examination  Result Value Ref Range   WBC, UA None seen 0 - 5 /hpf   RBC, UA None seen 0 - 2 /hpf   Epithelial Cells (non renal) CANCELED    Bacteria, UA None seen None seen/Few  Bayer DCA Hb A1c Waived  Result Value Ref Range   Bayer DCA Hb A1c Waived 5.5 <7.0 %  CBC with Differential/Platelet  Result Value Ref Range   WBC 6.7 3.4 - 10.8 x10E3/uL   RBC 4.47 4.14 - 5.80 x10E6/uL   Hemoglobin 13.5 13.0 - 17.7 g/dL   Hematocrit 40.4 37.5 - 51.0 %   MCV 90 79 - 97  fL   MCH 30.2 26.6 - 33.0 pg   MCHC 33.4 31.5 - 35.7 g/dL   RDW 13.9 12.3 - 15.4 %   Platelets 249 150 - 379 x10E3/uL   Neutrophils 47 Not Estab. %   Lymphs 39 Not Estab. %   Monocytes 14 Not Estab. %   Eos 0 Not Estab. %   Basos 0 Not Estab. %   Neutrophils Absolute 3.1 1.4 - 7.0 x10E3/uL   Lymphocytes Absolute 2.6 0.7 - 3.1 x10E3/uL   Monocytes Absolute 0.9 0.1 - 0.9 x10E3/uL   EOS (ABSOLUTE) 0.0 0.0 - 0.4 x10E3/uL   Basophils Absolute 0.0 0.0 - 0.2 x10E3/uL   Immature Granulocytes 0 Not Estab. %   Immature Grans (Abs) 0.0 0.0 - 0.1 x10E3/uL  Comprehensive metabolic panel  Result Value Ref Range   Glucose 107 (H) 65 - 99 mg/dL     BUN 21 8 - 27 mg/dL   Creatinine, Ser 1.50 (H) 0.76 - 1.27 mg/dL   GFR calc non Af Amer 45 (L) >59 mL/min/1.73   GFR calc Af Amer 52 (L) >59 mL/min/1.73   BUN/Creatinine Ratio 14 10 - 24   Sodium 141 134 - 144 mmol/L   Potassium 3.7 3.5 - 5.2 mmol/L   Chloride 102 96 - 106 mmol/L   CO2 23 20 - 29 mmol/L   Calcium 10.1 8.6 - 10.2 mg/dL   Total Protein 7.4 6.0 - 8.5 g/dL   Albumin 4.6 3.5 - 4.8 g/dL   Globulin, Total 2.8 1.5 - 4.5 g/dL   Albumin/Globulin Ratio 1.6 1.2 - 2.2   Bilirubin Total 0.6 0.0 - 1.2 mg/dL   Alkaline Phosphatase 62 39 - 117 IU/L   AST 24 0 - 40 IU/L   ALT 12 0 - 44 IU/L  Lipid Panel w/o Chol/HDL Ratio  Result Value Ref Range   Cholesterol, Total 196 100 - 199 mg/dL   Triglycerides 72 0 - 149 mg/dL   HDL 91 >39 mg/dL   VLDL Cholesterol Cal 14 5 - 40 mg/dL   LDL Calculated 91 0 - 99 mg/dL  Microalbumin, Urine Waived  Result Value Ref Range   Microalb, Ur Waived 10 0 - 19 mg/L   Creatinine, Urine Waived 100 10 - 300 mg/dL   Microalb/Creat Ratio <30 <30 mg/g  PSA  Result Value Ref Range   Prostate Specific Ag, Serum 0.5 0.0 - 4.0 ng/mL  HIV antibody  Result Value Ref Range   HIV Screen 4th Generation wRfx Non Reactive Non Reactive  TSH  Result Value Ref Range   TSH 4.720 (H) 0.450 - 4.500 uIU/mL  UA/M w/rflx Culture, Routine  Result Value Ref Range   Specific Gravity, UA 1.020 1.005 - 1.030   pH, UA 6.0 5.0 - 7.5   Color, UA Yellow Yellow   Appearance Ur Clear Clear   Leukocytes, UA Negative Negative   Protein, UA Negative Negative/Trace   Glucose, UA Negative Negative   Ketones, UA Negative Negative   RBC, UA Negative Negative   Bilirubin, UA Negative Negative   Urobilinogen, Ur 0.2 0.2 - 1.0 mg/dL   Nitrite, UA Negative Negative   Microscopic Examination See below:       Assessment & Plan:   Problem List Items Addressed This Visit      Genitourinary   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 Will start HCTZ and recheck 1 month. Call with any concerns. Checking BMP today.  Relevant Orders   Basic metabolic panel       Follow up plan: Return in about 4 weeks (around 05/27/2017) for Follow up BP.

## 2017-04-29 NOTE — Assessment & Plan Note (Signed)
Better, but still not great. Will start HCTZ and recheck 1 month. Call with any concerns. Checking BMP today.

## 2017-04-30 LAB — BASIC METABOLIC PANEL
BUN/Creatinine Ratio: 11 (ref 10–24)
BUN: 15 mg/dL (ref 8–27)
CHLORIDE: 102 mmol/L (ref 96–106)
CO2: 22 mmol/L (ref 20–29)
Calcium: 9.9 mg/dL (ref 8.6–10.2)
Creatinine, Ser: 1.31 mg/dL — ABNORMAL HIGH (ref 0.76–1.27)
GFR calc Af Amer: 62 mL/min/{1.73_m2} (ref >59)
GFR calc non Af Amer: 53 mL/min/{1.73_m2} — ABNORMAL LOW (ref >59)
GLUCOSE: 82 mg/dL (ref 65–99)
Potassium: 4.1 mmol/L (ref 3.5–5.2)
Sodium: 142 mmol/L (ref 134–144)

## 2017-05-03 ENCOUNTER — Encounter: Payer: Self-pay | Admitting: Family Medicine

## 2017-06-01 ENCOUNTER — Ambulatory Visit (INDEPENDENT_AMBULATORY_CARE_PROVIDER_SITE_OTHER): Payer: Medicare Other | Admitting: Family Medicine

## 2017-06-01 ENCOUNTER — Encounter: Payer: Self-pay | Admitting: Family Medicine

## 2017-06-01 VITALS — BP 112/62 | HR 90 | Temp 98.6°F | Wt 133.2 lb

## 2017-06-01 DIAGNOSIS — F1027 Alcohol dependence with alcohol-induced persisting dementia: Secondary | ICD-10-CM | POA: Diagnosis not present

## 2017-06-01 DIAGNOSIS — I129 Hypertensive chronic kidney disease with stage 1 through stage 4 chronic kidney disease, or unspecified chronic kidney disease: Secondary | ICD-10-CM | POA: Diagnosis not present

## 2017-06-01 MED ORDER — AMLODIPINE BESYLATE 10 MG PO TABS: 10.0000 mg | ORAL_TABLET | Freq: Every day | ORAL | 3 refills | Status: DC

## 2017-06-01 NOTE — Assessment & Plan Note (Signed)
Has not had work up in the past. We will get him into neurology for evaluation to see if there is anything they can do. Call with any concerns. Referral generated today.

## 2017-06-01 NOTE — Progress Notes (Signed)
BP 112/62 (BP Location: Left Arm, Cuff Size: Normal)   Pulse 90   Temp 98.6 F (37 C)   Wt 133 lb 4 oz (60.4 kg)   SpO2 96%   BMI 19.68 kg/m    Subjective:    Patient ID: Chad Banks, male    DOB: 1940/11/14, 76 y.o.   MRN: [id number]  HPI: Chad Banks is a 75 y.o. male  Chief Complaint  Patient presents with  . Hypertension   HYPERTENSION Hypertension status: better  Satisfied with current treatment? yes Duration of hypertension: chronic BP monitoring frequency:  rarely BP range:  BP medication side effects:  no Medication compliance: excellent compliance Previous BP meds: amlodipine, hctz Aspirin: no Recurrent headaches: no Visual changes: no Palpitations: no Dyspnea: no Chest pain: no Lower extremity edema: no Dizzy/lightheaded: no  Living in an assisted living now. Doing well most of the time, but gets confused off and on. Staff is wondering if there is anything that can be done in terms of his dementia. No other concerns or complaints at this time.   Relevant past medical, surgical, family and social history reviewed and updated as indicated. Interim medical history since our last visit reviewed. Allergies and medications reviewed and updated.  Review of Systems  Constitutional: Negative.   Respiratory: Negative.   Cardiovascular: Negative.   Psychiatric/Behavioral: Positive for confusion. Negative for agitation, behavioral problems, decreased concentration, dysphoric mood, hallucinations, self-injury, sleep disturbance and suicidal ideas. The patient is not nervous/anxious and is not hyperactive.     Per HPI unless specifically indicated above     Objective:    BP 112/62 (BP Location: Left Arm, Cuff Size: Normal)   Pulse 90   Temp 98.6 F (37 C)   Wt 133 lb 4 oz (60.4 kg)   SpO2 96%   BMI 19.68 kg/m   Wt Readings from Last 3 Encounters:  06/01/17 133 lb 4 oz (60.4 kg)  04/29/17 135 lb 6.4 oz (61.4 kg)  04/21/17 138 lb (62.6 kg)      Physical Exam  Constitutional: He is oriented to person, place, and time. He appears well-developed and well-nourished. No distress.  HENT:  Head: Normocephalic and atraumatic.  Right Ear: Hearing normal.  Left Ear: Hearing normal.  Nose: Nose normal.  Eyes: Conjunctivae and lids are normal. Right eye exhibits no discharge. Left eye exhibits no discharge. No scleral icterus.  Cardiovascular: Normal rate, regular rhythm, normal heart sounds and intact distal pulses. Exam reveals no gallop and no friction rub.  No murmur heard. Pulmonary/Chest: Effort normal and breath sounds normal. No respiratory distress. He has no wheezes. He has no rales. He exhibits no tenderness.  Musculoskeletal: Normal range of motion.  Neurological: He is alert and oriented to person, place, and time.  Skin: Skin is warm, dry and intact. No rash noted. He is not diaphoretic. No erythema. No pallor.  Psychiatric: He has a normal mood and affect. His speech is normal and behavior is normal. Judgment and thought content normal. Cognition and memory are normal.  Nursing note and vitals reviewed.   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 We will get him into neurology for evaluation to see if there is anything they can do. Call with any concerns. Referral generated today.      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 Continue current regimen. Continue to monitor. Checking BMP today. Refills given.       Relevant Orders   Basic metabolic panel       Follow up plan: Return in about 3 months (around 09/01/2017) for Wellness.

## 2017-06-01 NOTE — Assessment & Plan Note (Signed)
Under good control on current regimen. Continue current regimen. Continue to monitor. Checking BMP today. Refills given.

## 2017-06-02 ENCOUNTER — Encounter: Payer: Self-pay | Admitting: Family Medicine

## 2017-06-02 LAB — BASIC METABOLIC PANEL
BUN / CREAT RATIO: 15 (ref 10–24)
BUN: 23 mg/dL (ref 8–27)
CHLORIDE: 98 mmol/L (ref 96–106)
CO2: 28 mmol/L (ref 20–29)
Calcium: 10.5 mg/dL — ABNORMAL HIGH (ref 8.6–10.2)
Creatinine, Ser: 1.49 mg/dL — ABNORMAL HIGH (ref 0.76–1.27)
GFR calc non Af Amer: 46 mL/min/{1.73_m2} — ABNORMAL LOW (ref >59)
GFR, EST AFRICAN AMERICAN: 53 mL/min/{1.73_m2} — AB (ref >59)
GLUCOSE: 107 mg/dL — AB (ref 65–99)
Potassium: 3.7 mmol/L (ref 3.5–5.2)
SODIUM: 140 mmol/L (ref 134–144)

## 2017-09-01 ENCOUNTER — Encounter: Payer: Medicare Other | Admitting: Family Medicine

## 2017-09-02 ENCOUNTER — Encounter: Payer: Medicare Other | Admitting: Family Medicine

## 2017-09-17 ENCOUNTER — Telehealth: Payer: Self-pay

## 2017-09-17 NOTE — Telephone Encounter (Signed)
Copied from [place] [id number]. Topic: Medicare AWV >> Sep 17, 2017  2:01 PM Rejoice Heatwole A, LPN wrote: Reason for CRM: Called to schedule medicare annual wellness visit with NHA- Alayiah Fontes,LPN at [facility]. Please schedule anytime. Any questions please contact Joanette Gula on skype or by phone- [phone]

## 2017-10-07 ENCOUNTER — Ambulatory Visit (INDEPENDENT_AMBULATORY_CARE_PROVIDER_SITE_OTHER): Payer: Medicare Other | Admitting: Family Medicine

## 2017-10-07 ENCOUNTER — Encounter: Payer: Self-pay | Admitting: Family Medicine

## 2017-10-07 ENCOUNTER — Ambulatory Visit (INDEPENDENT_AMBULATORY_CARE_PROVIDER_SITE_OTHER): Payer: Medicare Other

## 2017-10-07 VITALS — BP 137/83 | HR 74 | Temp 97.6°F | Ht 68.0 in | Wt 134.0 lb

## 2017-10-07 VITALS — BP 137/83 | HR 74 | Temp 97.6°F | Resp 17 | Ht 68.0 in | Wt 134.0 lb

## 2017-10-07 DIAGNOSIS — Z0000 Encounter for general adult medical examination without abnormal findings: Secondary | ICD-10-CM

## 2017-10-07 DIAGNOSIS — E782 Mixed hyperlipidemia: Secondary | ICD-10-CM

## 2017-10-07 DIAGNOSIS — F1027 Alcohol dependence with alcohol-induced persisting dementia: Secondary | ICD-10-CM | POA: Diagnosis not present

## 2017-10-07 DIAGNOSIS — R3911 Hesitancy of micturition: Secondary | ICD-10-CM

## 2017-10-07 DIAGNOSIS — N183 Chronic kidney disease, stage 3 unspecified: Secondary | ICD-10-CM

## 2017-10-07 DIAGNOSIS — I129 Hypertensive chronic kidney disease with stage 1 through stage 4 chronic kidney disease, or unspecified chronic kidney disease: Secondary | ICD-10-CM

## 2017-10-07 DIAGNOSIS — Z0001 Encounter for general adult medical examination with abnormal findings: Secondary | ICD-10-CM | POA: Diagnosis not present

## 2017-10-07 LAB — MICROALBUMIN, URINE WAIVED
Creatinine, Urine Waived: 200 mg/dL (ref 10–300)
MICROALB, UR WAIVED: 30 mg/L — AB (ref 0–19)
Microalb/Creat Ratio: <30 mg/g (ref <30)

## 2017-10-07 LAB — UA/M W/RFLX CULTURE, ROUTINE
BILIRUBIN UA: NEGATIVE
Glucose, UA: NEGATIVE
Ketones, UA: NEGATIVE
LEUKOCYTES UA: NEGATIVE
Nitrite, UA: NEGATIVE
PH UA: 5.5 (ref 5.0–7.5)
PROTEIN UA: NEGATIVE
RBC, UA: NEGATIVE
Specific Gravity, UA: 1.01 (ref 1.005–1.030)
Urobilinogen, Ur: 0.2 mg/dL (ref 0.2–1.0)

## 2017-10-07 NOTE — Assessment & Plan Note (Signed)
Rechecking levels today. Await results. Call with any concerns.  

## 2017-10-07 NOTE — Progress Notes (Signed)
Subjective:   Chad Banks is a 77 y.o. male who presents for Medicare Annual/Subsequent preventive examination.  Review of Systems:   Cardiac Risk Factors include: hypertension;advanced age (>58men, >42 women);smoking/ tobacco exposure;sedentary lifestyle     Objective:    Vitals: BP 137/83 (BP Location: Left Arm, Patient Position: Sitting)   Pulse 74   Temp 97.6 F (36.4 C) (Temporal)   Resp 17   Ht 5\' 8"  (1.727 m)   Wt 134 lb (60.8 kg)   SpO2 95%   BMI 20.37 kg/m   Body mass index is 20.37 kg/m.  Advanced Directives 10/07/2017 01/16/2017 01/11/2017  Does Patient Have a Medical Advance Directive? (No Data) No No  Would patient like information on creating a medical advance directive? - No - Patient declined No - Patient declined    Tobacco Social History   Tobacco Use  Smoking Status Former Smoker  . Last attempt to quit: 07/20/2014  . Years since quitting: 3.2  Smokeless Tobacco Never Used     Counseling given: Not Answered   Clinical Intake:  Pre-visit preparation completed: Yes  Pain : No/denies pain     Nutritional Status: BMI of 19-24  Normal Nutritional Risks: None Diabetes: No  How often do you need to have someone help you when you read instructions, pamphlets, or other written materials from your doctor or pharmacy?: 1 - Never What is the last grade level you completed in school?: 11th grade  Interpreter Needed?: No  Information entered by :: Chayla Shands,LPN   Past Medical History:  Diagnosis Date  . 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 Chronic kidney disease   . COPD (chronic obstructive pulmonary disease) ([place])   . Eye injuries    multiple, Under care of Opthamology, Right Eye  . Hypertension   . Metabolic encephalopathy   . 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 No pertinent surgical history. Family History  Problem Relation Age of Onset  . Arthritis Mother   . Hypertension Sister   . Alcohol abuse  Brother   . Asthma Brother   . Birth defects Brother    Social History   Socioeconomic History  . Marital status: Single    Spouse name: Not on file  . Number of children: Not on file  . Years of education: Not on file  . Highest education level: Not on file  Occupational History  . Not on file  Social Needs  . Financial resource strain: Not hard at all  . Food insecurity:    Worry: Never true    Inability: Never true  . Transportation needs:    Medical: No    Non-medical: No  Tobacco Use  . Smoking status: Former Smoker    Last attempt to quit: 07/20/2014    Years since quitting: 3.2  . Smokeless tobacco: Never Used  Substance and Sexual Activity  . Alcohol use: Not Currently    Alcohol/week: 16.8 oz    Types: 28 Cans of beer per week  . Drug use: No  . Sexual activity: Not on file  Lifestyle  . Physical activity:    Days per week: 0 days    Minutes per session: 0 min  . Stress: Not at all  Relationships  . Social connections:    Talks on phone: Never    Gets together: More than three times a week    Attends religious service: Never    Active member of club or organization:  No    Attends meetings of clubs or organizations: Never    Relationship status: Never married  Other Topics Concern  . Not on file  Social History Narrative  . Not on file    Outpatient Encounter Medications as of 10/07/2017  Medication Sig  . amLODipine (NORVASC) 10 MG tablet Take 1 tablet (10 mg total) daily by mouth.  . hydrochlorothiazide (HYDRODIURIL) 25 MG tablet Take 1 tablet (25 mg total) by mouth daily.  . memantine (NAMENDA XR) 28 MG CP24 24 hr capsule    No facility-administered encounter medications on file as of 10/07/2017.     Activities of Daily Living In your present state of health, do you have any difficulty performing the following activities: 10/07/2017 04/21/2017  Hearing? N N  Vision? 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  N Y  Walking or climbing stairs? N N  Dressing or bathing? N Y  Doing errands, shopping? Y Y  Comment group home does transportation  -  Conservation officer, nature and eating ? N -  Using the Toilet? N -  In the past six months, have you accidently leaked urine? N -  Do you have problems with loss of bowel control? N -  Managing your Medications? N -  Managing your Finances? N -  Housekeeping or managing your Housekeeping? N -  Some recent data might be hidden    Patient Care Team: Valerie Roys, DO as PCP - General (Family Medicine)   Assessment:   This is a routine wellness examination for Chad Banks.  Exercise Activities and Dietary recommendations Current Exercise Habits: The patient does not participate in regular exercise at present, Exercise limited by: None identified  Goals    . DIET - INCREASE WATER INTAKE     Recommend drinking at least 6-8 glasses of water a day        Fall Risk Fall Risk  10/07/2017 04/21/2017 12/26/2014  Falls in the past year? No No No   Is the patient's home free of loose throw rugs in walkways, pet beds, electrical cords, etc?   yes      Grab bars in the bathroom? yes      Handrails on the stairs?   no      Adequate lighting?   yes  Timed Get Up and Go Performed: Completed in 8 seconds with no use of assistive devices, steady gait. No intervention needed at this time.   Depression Screen PHQ 2/9 Scores 10/07/2017 04/21/2017 12/26/2014  PHQ - 2 Score 0 0 0    Cognitive Function     6CIT Screen 10/07/2017  What Year? 4 points  What month? 3 points  What time? 3 points  Count back from 20 2 points  Months in reverse 4 points  Repeat phrase 10 points  Total Score 26    Immunization History  Administered Date(s) Administered  . Influenza, High Dose Seasonal PF 04/21/2017  . Influenza-Unspecified 06/04/2014  . Pneumococcal Conjugate-13 06/04/2014, 04/21/2017  . Td 04/21/2017    Qualifies for Shingles Vaccine? Yes, discussed shingrix vaccine.    Screening Tests Health Maintenance  Topic Date Due  . COLONOSCOPY  12/10/1992  . PNA vac Low Risk Adult (2 of 2 - PPSV23) 04/21/2018  . TETANUS/TDAP  04/22/2027  . INFLUENZA VACCINE  Completed   Cancer Screenings: Lung: Low Dose CT Chest recommended if Age 69-80 years, 30 pack-year currently smoking OR have quit w/in 15years. Patient  does not qualify. Colorectal: unable to perform   Additional Screenings:  Hepatitis B/HIV/Syphillis:not indicated Hepatitis C Screening: not indicated    Plan:  I have personally reviewed and addressed the Medicare Annual Wellness questionnaire and have noted the following in the patient's chart:  A. Medical and social history B. Use of alcohol, tobacco or illicit drugs  C. Current medications and supplements D. Functional ability and status E.  Nutritional status F.  Physical activity G. Advance directives H. List of other physicians I.  Hospitalizations, surgeries, and ER visits in previous 12 months J.  [place] such as hearing and vision if needed, cognitive and depression L. Referrals and appointments   In addition, I have reviewed and discussed with patient certain preventive protocols, quality metrics, and best practice recommendations. A written personalized care plan for preventive services as well as general preventive health recommendations were provided to patient.   Signed,  Tyler Aas, LPN Nurse Health Advisor   Nurse Notes:none

## 2017-10-07 NOTE — Assessment & Plan Note (Signed)
In assisted living. Getting help. Care giver here today. Call with any concerns.

## 2017-10-07 NOTE — Progress Notes (Signed)
BP 137/83 (BP Location: Left Arm, Patient Position: Sitting, Cuff Size: Normal)   Pulse 74   Temp 97.6 F (36.4 C)   Ht 5\' 8"  (1.727 m)   Wt 134 lb (60.8 kg)   BMI 20.37 kg/m    Subjective:    Patient ID: Chad Banks, male    DOB: 1940/10/30, 77 y.o.   MRN: [id number]  HPI: Chad Banks is a 77 y.o. male presenting on 10/07/2017 for comprehensive medical examination. Current medical complaints include:  HYPERTENSION / HYPERLIPIDEMIA Satisfied with current treatment? yes Duration of hypertension: chronic BP monitoring frequency: not checking BP medication side effects: no Past BP meds: amlodipine and hctz Duration of hyperlipidemia: chronic Cholesterol medication side effects: Not on anything Cholesterol supplements: none Past cholesterol medications: none Medication compliance: excellent compliance Aspirin: no Recent stressors: no Recurrent headaches: no Visual changes: no Palpitations: no Dyspnea: no Chest pain: no Lower extremity edema: no Dizzy/lightheaded: no  He currently lives with: assisted living Interim Problems from his last visit: no  Depression Screen done today and results listed below:  Depression screen [facility] 2/9 10/07/2017 04/21/2017 12/26/2014  Decreased Interest 0 0 0  Down, Depressed, Hopeless 0 0 0  PHQ - 2 Score 0 0 0    Past Medical History:  Past Medical History:  Diagnosis Date  . 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 Chronic kidney disease   . COPD (chronic obstructive pulmonary disease) ([place])   . Eye injuries    multiple, Under care of Opthamology, Right Eye  . Hypertension   . Metabolic encephalopathy   . 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 No pertinent surgical history.  Medications:  Current Outpatient Medications on File Prior to Visit  Medication Sig  . amLODipine (NORVASC) 10 MG tablet Take 1 tablet (10 mg total) daily by mouth.  . hydrochlorothiazide (HYDRODIURIL) 25 MG  tablet Take 1 tablet (25 mg total) by mouth daily.   No current facility-administered medications on file prior to visit.     Allergies:  Allergies  Allergen Reactions  . Lisinopril     Chest Pain    Social History:  Social History   Socioeconomic History  . Marital status: Single    Spouse name: Not on file  . Number of children: Not on file  . Years of education: Not on file  . Highest education level: Not on file  Occupational History  . Not on file  Social Needs  . Financial resource strain: Not hard at all  . Food insecurity:    Worry: Never true    Inability: Never true  . Transportation needs:    Medical: No    Non-medical: No  Tobacco Use  . Smoking status: Former Smoker    Last attempt to quit: 07/20/2014    Years since quitting: 3.2  . Smokeless tobacco: Never Used  Substance and Sexual Activity  . Alcohol use: Not Currently    Alcohol/week: 16.8 oz    Types: 28 Cans of beer per week  . Drug use: No  . Sexual activity: Not on file  Lifestyle  . Physical activity:    Days per week: 0 days    Minutes per session: 0 min  . Stress: Not at all  Relationships  . Social connections:    Talks on phone: Never    Gets together: More than three times a week    Attends religious service: Never  Active member of club or organization: No    Attends meetings of clubs or organizations: Never    Relationship status: Never married  . Intimate partner violence:    Fear of current or ex partner: No    Emotionally abused: No    Physically abused: No    Forced sexual activity: No  Other Topics Concern  . Not on file  Social History Narrative  . Not on file   Social History   Tobacco Use  Smoking Status Former Smoker  . Last attempt to quit: 07/20/2014  . Years since quitting: 3.2  Smokeless Tobacco Never Used   Social History   Substance and Sexual Activity  Alcohol Use Not Currently  . Alcohol/week: 16.8 oz  . Types: 28 Cans of beer per week     Family History:  Family History  Problem Relation Age of Onset  . Arthritis Mother   . Hypertension Sister   . Alcohol abuse Brother   . Asthma Brother   . Birth defects Brother     Past medical history, surgical history, medications, allergies, family history and social history reviewed with patient today and changes made to appropriate areas of the chart.   Review of Systems  Reason unable to perform ROS: Limited due to dementia, caregiver supplemented.  Constitutional: Negative.   HENT: Negative.   Eyes: Positive for blurred vision. Negative for double vision, photophobia, pain, discharge and redness.  Respiratory: Negative.   Cardiovascular: Negative.   Gastrointestinal: Negative.   Genitourinary: Negative.        Some hesiancy  Musculoskeletal: Negative.   Skin: Negative.   Neurological: Negative.   Endo/Heme/Allergies: Negative.   Psychiatric/Behavioral: Positive for memory loss. Negative for depression, hallucinations, substance abuse and suicidal ideas. The patient is not nervous/anxious and does not have insomnia.     All other ROS negative except what is listed above and in the HPI.      Objective:    BP 137/83 (BP Location: Left Arm, Patient Position: Sitting, Cuff Size: Normal)   Pulse 74   Temp 97.6 F (36.4 C)   Ht 5\' 8"  (1.727 m)   Wt 134 lb (60.8 kg)   BMI 20.37 kg/m   Wt Readings from Last 3 Encounters:  10/07/17 134 lb (60.8 kg)  10/07/17 134 lb (60.8 kg)  06/01/17 133 lb 4 oz (60.4 kg)    Physical Exam  Constitutional: He is oriented to person, place, and time. He appears well-developed and well-nourished. No distress.  HENT:  Head: Normocephalic and atraumatic.  Right Ear: Hearing, tympanic membrane, external ear and ear canal normal.  Left Ear: Hearing, tympanic membrane, external ear and ear canal normal.  Nose: Nose normal.  Mouth/Throat: Uvula is midline, oropharynx is clear and moist and mucous membranes are normal. No oropharyngeal  exudate.  Eyes: Pupils are equal, round, and reactive to light. EOM and lids are normal. Right eye exhibits no discharge. Left eye exhibits no discharge. No scleral icterus.  cararact R eye  Neck: Normal range of motion. Neck supple. No JVD present. No tracheal deviation present. No thyromegaly present.  Cardiovascular: Normal rate, regular rhythm, normal heart sounds and intact distal pulses. Exam reveals no gallop and no friction rub.  No murmur heard. Pulmonary/Chest: Effort normal and breath sounds normal. No stridor. No respiratory distress. He has no wheezes. He has no rales. He exhibits no tenderness.  Abdominal: Soft. Bowel sounds are normal. He exhibits no distension and no mass. There is no tenderness.  There is no rebound and no guarding.  Genitourinary:  Genitourinary Comments: Genital and prostate exam deferred today at patient's request   Musculoskeletal: Normal range of motion. He exhibits no edema, tenderness or deformity.  Lymphadenopathy:    He has no cervical adenopathy.  Neurological: He is alert and oriented to person, place, and time. He has normal reflexes. He displays normal reflexes. No cranial nerve deficit. He exhibits normal muscle tone. Coordination normal.  Skin: Skin is warm, dry and intact. No rash noted. He is not diaphoretic. No erythema. No pallor.  Psychiatric: He has a normal mood and affect. His speech is normal and behavior is normal. Judgment and thought content normal. Cognition and memory are normal.  Nursing note and vitals reviewed.   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 Getting help. Care giver here today. Call with any concerns.       Relevant Orders   CBC with Differential/Platelet   Comprehensive metabolic panel   UA/M w/rflx Culture, Routine     Genitourinary   Benign hypertensive renal disease (Chronic)    Under good control. Continue current regimen. Continue to monitor. Call with any concerns. Refills given.       Relevant Orders   CBC with Differential/Platelet   Comprehensive metabolic panel   Microalbumin, Urine Waived   TSH   UA/M w/rflx Culture, Routine   Chronic kidney disease    Rechecking levels today. Await results. Call with any concerns.       Relevant Orders   CBC with Differential/Platelet   Comprehensive metabolic panel   UA/M w/rflx Culture, Routine     Other   Hyperlipidemia    Under good control. Continue current regimen. Continue to monitor. Call with any concerns. Refills given.       Relevant Orders   CBC with Differential/Platelet   Comprehensive metabolic panel   Lipid Panel w/o Chol/HDL Ratio   UA/M w/rflx Culture, Routine    Other Visit Diagnoses    Routine general medical examination at a health care facility    -  Primary   Hesitancy       PSA checked today.   Relevant Orders   PSA      LABORATORY TESTING:  Health maintenance labs ordered today as discussed above.   The natural history of prostate cancer and ongoing controversy regarding screening and potential treatment outcomes of prostate cancer has been discussed with the patient. The meaning of a false positive PSA and a false negative PSA has been discussed. He indicates understanding of the limitations of this screening test and wishes to proceed with screening PSA testing.   IMMUNIZATIONS:   -  Tdap: Tetanus vaccination  status reviewed: last tetanus booster within 10 years. - Influenza: Up to date - Pneumovax: Not applicable - Prevnar: Up to date - Zostavax vaccine: Not applicable  SCREENING: - Colonoscopy: Refused  Discussed with patient purpose of the colonoscopy is to detect colon cancer at curable precancerous or early stages   PATIENT COUNSELING:    Sexuality: Discussed sexually transmitted diseases, partner selection, use of condoms, avoidance of unintended pregnancy  and contraceptive alternatives.   Advised to avoid cigarette smoking.  I discussed with the patient that most people either abstain from alcohol or drink within safe limits (<=14/week and <=4 drinks/occasion for males, <=7/weeks and <= 3 drinks/occasion for females) and that the risk for alcohol disorders and other health effects rises proportionally with the number of drinks per week and how often a drinker exceeds daily limits.  Discussed cessation/primary prevention of drug use and availability of treatment for abuse.   Diet: Encouraged to adjust caloric intake to maintain  or achieve ideal body weight, to reduce intake of dietary saturated fat and total fat, to limit sodium intake by avoiding high sodium foods and not adding table salt, and to maintain adequate dietary potassium and calcium preferably from fresh fruits, vegetables, and low-fat dairy products.    stressed the importance of regular exercise  Injury prevention: Discussed safety belts, safety helmets, smoke detector, smoking near bedding or upholstery.   Dental health: Discussed importance of regular tooth brushing, flossing, and dental visits.   Follow up plan: NEXT PREVENTATIVE PHYSICAL DUE IN 1 YEAR. Return in about 6 months (around 04/09/2018) for [place] up.

## 2017-10-07 NOTE — Assessment & Plan Note (Signed)
Under good control. Continue current regimen. Continue to monitor. Call with any concerns. Refills given.  

## 2017-10-07 NOTE — Patient Instructions (Signed)
[name] , Thank you for taking time to come for your Medicare Wellness Visit. I appreciate your ongoing commitment to your health goals. Please review the following plan we discussed and let me know if I can assist you in the future.   Screening recommendations/referrals: Colonoscopy: unable to perform  Recommended yearly ophthalmology/optometry visit for glaucoma screening and checkup Recommended yearly dental visit for hygiene and checkup  Vaccinations: Influenza vaccine: up to date Pneumococcal vaccine: up to date Tdap vaccine: up to date Shingles vaccine: eligible, check with your insurance company for coverage     Advanced directives:  n/a  Conditions/risks identified: Recommend drinking at least 6-8 glasses of water a day  Next appointment: Follow up in one year for your annual wellness exam.   Preventive Care 65 Years and Older, Male Preventive care refers to lifestyle choices and visits with your health care provider that can promote health and wellness. What does preventive care include?  A yearly physical exam. This is also called an annual well check.  Dental exams once or twice a year.  Routine eye exams. Ask your health care provider how often you should have your eyes checked.  Personal lifestyle choices, including:  Daily care of your teeth and gums.  Regular physical activity.  Eating a healthy diet.  Avoiding tobacco and drug use.  Limiting alcohol use.  Practicing safe sex.  Taking low doses of aspirin every day.  Taking vitamin and mineral supplements as recommended by your health care provider. What happens during an annual well check? The services and screenings done by your health care provider during your annual well check will depend on your age, overall health, lifestyle risk factors, and family history of disease. Counseling  Your health care provider may ask you questions about your:  Alcohol use.  Tobacco use.  Drug  use.  Emotional well-being.  Home and relationship well-being.  Sexual activity.  Eating habits.  History of falls.  Memory and ability to understand (cognition).  Work and work Statistician. Screening  You may have the following tests or measurements:  Height, weight, and BMI.  Blood pressure.  Lipid and cholesterol levels. These may be checked every 5 years, or more frequently if you are over 89 years old.  Skin check.  Lung cancer screening. You may have this screening every year starting at age 106 if you have a 30-pack-year history of smoking and currently smoke or have quit within the past 15 years.  Fecal occult blood test (FOBT) of the stool. You may have this test every year starting at age 69.  Flexible sigmoidoscopy or colonoscopy. You may have a sigmoidoscopy every 5 years or a colonoscopy every 10 years starting at age 50.  Prostate cancer screening. Recommendations will vary depending on your family history and other risks.  Hepatitis C blood test.  Hepatitis B blood test.  Sexually transmitted disease (STD) testing.  Diabetes screening. This is done by checking your blood sugar (glucose) after you have not eaten for a while (fasting). You may have this done every 1-3 years.  Abdominal aortic aneurysm (AAA) screening. You may need this if you are a current or former smoker.  Osteoporosis. You may be screened starting at age 41 if you are at high risk. Talk with your health care provider about your test results, treatment options, and if necessary, the need for more tests. Vaccines  Your health care provider may recommend certain vaccines, such as:  Influenza vaccine. This is recommended every  year.  Tetanus, diphtheria, and acellular pertussis (Tdap, Td) vaccine. You may need a Td booster every 10 years.  Zoster vaccine. You may need this after age 61.  Pneumococcal 13-valent conjugate (PCV13) vaccine. One dose is recommended after age  11.  Pneumococcal polysaccharide (PPSV23) vaccine. One dose is recommended after age 51. Talk to your health care provider about which screenings and vaccines you need and how often you need them. This information is not intended to replace advice given to you by your health care provider. Make sure you discuss any questions you have with your health care provider. Document Released: 08/02/2015 Document Revised: 03/25/2016 Document Reviewed: 05/07/2015 Elsevier Interactive Patient Education  2017 [place] Prevention in the Home Falls can cause injuries. They can happen to people of all ages. There are many things you can do to make your home safe and to help prevent falls. What can I do on the outside of my home?  Regularly fix the edges of walkways and driveways and fix any cracks.  Remove anything that might make you trip as you walk through a door, such as a raised step or threshold.  Trim any bushes or trees on the path to your home.  Use bright outdoor lighting.  Clear any walking paths of anything that might make someone trip, such as rocks or tools.  Regularly check to see if handrails are loose or broken. Make sure that both sides of any steps have handrails.  Any raised decks and porches should have guardrails on the edges.  Have any leaves, snow, or ice cleared regularly.  Use sand or salt on walking paths during winter.  Clean up any spills in your garage right away. This includes oil or grease spills. What can I do in the bathroom?  Use night lights.  Install grab bars by the toilet and in the tub and shower. Do not use towel bars as grab bars.  Use non-skid mats or decals in the tub or shower.  If you need to sit down in the shower, use a plastic, non-slip stool.  Keep the floor dry. Clean up any water that spills on the floor as soon as it happens.  Remove soap buildup in the tub or shower regularly.  Attach bath mats securely with double-sided  non-slip rug tape.  Do not have throw rugs and other things on the floor that can make you trip. What can I do in the bedroom?  Use night lights.  Make sure that you have a light by your bed that is easy to reach.  Do not use any sheets or blankets that are too big for your bed. They should not hang down onto the floor.  Have a firm chair that has side arms. You can use this for support while you get dressed.  Do not have throw rugs and other things on the floor that can make you trip. What can I do in the kitchen?  Clean up any spills right away.  Avoid walking on wet floors.  Keep items that you use a lot in easy-to-reach places.  If you need to reach something above you, use a strong step stool that has a grab bar.  Keep electrical cords out of the way.  Do not use floor polish or wax that makes floors slippery. If you must use wax, use non-skid floor wax.  Do not have throw rugs and other things on the floor that can make you trip. What can  I do with my stairs?  Do not leave any items on the stairs.  Make sure that there are handrails on both sides of the stairs and use them. Fix handrails that are broken or loose. Make sure that handrails are as long as the stairways.  Check any carpeting to make sure that it is firmly attached to the stairs. Fix any carpet that is loose or worn.  Avoid having throw rugs at the top or bottom of the stairs. If you do have throw rugs, attach them to the floor with carpet tape.  Make sure that you have a light switch at the top of the stairs and the bottom of the stairs. If you do not have them, ask someone to add them for you. What else can I do to help prevent falls?  Wear shoes that:  Do not have high heels.  Have rubber bottoms.  Are comfortable and fit you well.  Are closed at the toe. Do not wear sandals.  If you use a stepladder:  Make sure that it is fully opened. Do not climb a closed stepladder.  Make sure that both  sides of the stepladder are locked into place.  Ask someone to hold it for you, if possible.  Clearly mark and make sure that you can see:  Any grab bars or handrails.  First and last steps.  Where the edge of each step is.  Use tools that help you move around (mobility aids) if they are needed. These include:  Canes.  Walkers.  Scooters.  Crutches.  Turn on the lights when you go into a dark area. Replace any light bulbs as soon as they burn out.  Set up your furniture so you have a clear path. Avoid moving your furniture around.  If any of your floors are uneven, fix them.  If there are any pets around you, be aware of where they are.  Review your medicines with your doctor. Some medicines can make you feel dizzy. This can increase your chance of falling. Ask your doctor what other things that you can do to help prevent falls. This information is not intended to replace advice given to you by your health care provider. Make sure you discuss any questions you have with your health care provider. Document Released: 05/02/2009 Document Revised: 12/12/2015 Document Reviewed: 08/10/2014 Elsevier Interactive Patient Education  2017 [employer].

## 2017-10-08 ENCOUNTER — Encounter: Payer: Self-pay | Admitting: Family Medicine

## 2017-10-08 LAB — CBC WITH DIFFERENTIAL/PLATELET
Basophils Absolute: 0 10*3/uL (ref 0.0–0.2)
Basos: 0 %
EOS (ABSOLUTE): 0 10*3/uL (ref 0.0–0.4)
Eos: 0 %
HEMATOCRIT: 38 % (ref 37.5–51.0)
Hemoglobin: 13.6 g/dL (ref 13.0–17.7)
IMMATURE GRANS (ABS): 0 10*3/uL (ref 0.0–0.1)
Immature Granulocytes: 1 %
LYMPHS: 33 %
Lymphocytes Absolute: 2.2 10*3/uL (ref 0.7–3.1)
MCH: 31 pg (ref 26.6–33.0)
MCHC: 35.8 g/dL — ABNORMAL HIGH (ref 31.5–35.7)
MCV: 87 fL (ref 79–97)
MONOS ABS: 1 10*3/uL — AB (ref 0.1–0.9)
Monocytes: 16 %
NEUTROS ABS: 3.4 10*3/uL (ref 1.4–7.0)
Neutrophils: 50 %
PLATELETS: 285 10*3/uL (ref 150–379)
RBC: 4.39 x10E6/uL (ref 4.14–5.80)
RDW: 13.9 % (ref 12.3–15.4)
WBC: 6.7 10*3/uL (ref 3.4–10.8)

## 2017-10-08 LAB — COMPREHENSIVE METABOLIC PANEL
A/G RATIO: 1.5 (ref 1.2–2.2)
ALT: 12 IU/L (ref 0–44)
AST: 17 IU/L (ref 0–40)
Albumin: 4.6 g/dL (ref 3.5–4.8)
Alkaline Phosphatase: 88 IU/L (ref 39–117)
BILIRUBIN TOTAL: 0.6 mg/dL (ref 0.0–1.2)
BUN / CREAT RATIO: 16 (ref 10–24)
BUN: 19 mg/dL (ref 8–27)
CO2: 24 mmol/L (ref 20–29)
Calcium: 10 mg/dL (ref 8.6–10.2)
Chloride: 98 mmol/L (ref 96–106)
Creatinine, Ser: 1.21 mg/dL (ref 0.76–1.27)
GFR calc non Af Amer: 59 mL/min/{1.73_m2} — ABNORMAL LOW (ref >59)
GFR, EST AFRICAN AMERICAN: 68 mL/min/{1.73_m2} (ref >59)
GLOBULIN, TOTAL: 3 g/dL (ref 1.5–4.5)
Glucose: 61 mg/dL — ABNORMAL LOW (ref 65–99)
POTASSIUM: 3.3 mmol/L — AB (ref 3.5–5.2)
SODIUM: 141 mmol/L (ref 134–144)
TOTAL PROTEIN: 7.6 g/dL (ref 6.0–8.5)

## 2017-10-08 LAB — LIPID PANEL W/O CHOL/HDL RATIO
Cholesterol, Total: 215 mg/dL — ABNORMAL HIGH (ref 100–199)
HDL: 65 mg/dL (ref >39)
LDL Calculated: 127 mg/dL — ABNORMAL HIGH (ref 0–99)
Triglycerides: 115 mg/dL (ref 0–149)
VLDL Cholesterol Cal: 23 mg/dL (ref 5–40)

## 2017-10-08 LAB — PSA: PROSTATE SPECIFIC AG, SERUM: 0.6 ng/mL (ref 0.0–4.0)

## 2017-10-08 LAB — TSH: TSH: 4.35 u[IU]/mL (ref 0.450–4.500)

## 2018-04-11 ENCOUNTER — Other Ambulatory Visit: Payer: Self-pay

## 2018-04-11 ENCOUNTER — Ambulatory Visit (INDEPENDENT_AMBULATORY_CARE_PROVIDER_SITE_OTHER): Payer: Medicare Other | Admitting: Family Medicine

## 2018-04-11 ENCOUNTER — Encounter: Payer: Self-pay | Admitting: Family Medicine

## 2018-04-11 VITALS — BP 137/77 | HR 89 | Temp 98.0°F | Ht 68.0 in | Wt 136.0 lb

## 2018-04-11 DIAGNOSIS — N183 Chronic kidney disease, stage 3 unspecified: Secondary | ICD-10-CM

## 2018-04-11 DIAGNOSIS — I129 Hypertensive chronic kidney disease with stage 1 through stage 4 chronic kidney disease, or unspecified chronic kidney disease: Secondary | ICD-10-CM | POA: Diagnosis not present

## 2018-04-11 DIAGNOSIS — E782 Mixed hyperlipidemia: Secondary | ICD-10-CM | POA: Diagnosis not present

## 2018-04-11 DIAGNOSIS — F1027 Alcohol dependence with alcohol-induced persisting dementia: Secondary | ICD-10-CM

## 2018-04-11 DIAGNOSIS — R059 Cough, unspecified: Secondary | ICD-10-CM

## 2018-04-11 DIAGNOSIS — Z23 Encounter for immunization: Secondary | ICD-10-CM | POA: Diagnosis not present

## 2018-04-11 DIAGNOSIS — R6889 Other general symptoms and signs: Secondary | ICD-10-CM

## 2018-04-11 DIAGNOSIS — R05 Cough: Secondary | ICD-10-CM

## 2018-04-11 MED ORDER — HYDROCHLOROTHIAZIDE 25 MG PO TABS: 25.0000 mg | ORAL_TABLET | Freq: Every day | ORAL | 3 refills | Status: DC

## 2018-04-11 MED ORDER — AMLODIPINE BESYLATE 10 MG PO TABS: 10.0000 mg | ORAL_TABLET | Freq: Every day | ORAL | 3 refills | Status: DC

## 2018-04-11 MED ORDER — ALBUTEROL SULFATE HFA 108 (90 BASE) MCG/ACT IN AERS: 2.0000 | INHALATION_SPRAY | Freq: Four times a day (QID) | RESPIRATORY_TRACT | 3 refills | Status: DC | PRN

## 2018-04-11 NOTE — Progress Notes (Signed)
BP 137/77   Pulse 89   Temp 98 F (36.7 C) (Oral)   Ht 5\' 8"  (1.727 m)   Wt 136 lb (61.7 kg)   SpO2 97%   BMI 20.68 kg/m    Subjective:    Patient ID: Chad Banks, male    DOB: 1940/09/29, 77 y.o.   MRN: [id number]  HPI: Chad Banks is a 77 y.o. male  Chief Complaint  Patient presents with  . Hypertension    10m f/u  . Hyperlipidemia   HYPERTENSION / HYPERLIPIDEMIA Satisfied with current treatment? yes Duration of hypertension: chronic BP monitoring frequency: not checking BP medication side effects: no Past BP meds: amlodipine, HCTZ Duration of hyperlipidemia: chronic Cholesterol medication side effects: Not on anything Cholesterol supplements: none Medication compliance: excellent compliance Aspirin: no Recent stressors: no Recurrent headaches: no Visual changes: no Palpitations: no Dyspnea: no Chest pain: no Lower extremity edema: no Dizzy/lightheaded: no  Has had a cough since Thursday. No fever. Non-productive. Otherwise feeling well.   Relevant past medical, surgical, family and social history reviewed and updated as indicated. Interim medical history since our last visit reviewed. Allergies and medications reviewed and updated.  Review of Systems  Constitutional: Negative.   HENT: Negative.   Eyes: Negative.   Respiratory: Positive for cough. Negative for apnea, choking, chest tightness, shortness of breath, wheezing and stridor.   Cardiovascular: Negative.   Psychiatric/Behavioral: Negative.     Per HPI unless specifically indicated above     Objective:    BP 137/77   Pulse 89   Temp 98 F (36.7 C) (Oral)   Ht 5\' 8"  (1.727 m)   Wt 136 lb (61.7 kg)   SpO2 97%   BMI 20.68 kg/m   Wt Readings from Last 3 Encounters:  04/11/18 136 lb (61.7 kg)  10/07/17 134 lb (60.8 kg)  10/07/17 134 lb (60.8 kg)    Physical Exam  Constitutional: He is oriented to person, place, and time. He appears well-developed and well-nourished. No  distress.  HENT:  Head: Normocephalic and atraumatic.  Right Ear: Hearing and external ear normal.  Left Ear: Hearing and external ear normal.  Nose: Nose normal.  Mouth/Throat: Oropharynx is clear and moist.  Eyes: Pupils are equal, round, and reactive to light. Conjunctivae, EOM and lids are normal. Right eye exhibits no discharge. Left eye exhibits no discharge. No scleral icterus.  Neck: Normal range of motion. Neck supple. No JVD present. No tracheal deviation present. No thyromegaly present.  Cardiovascular: Normal rate, regular rhythm, normal heart sounds and intact distal pulses. Exam reveals no gallop and no friction rub.  No murmur heard. Pulmonary/Chest: Effort normal and breath sounds normal. No stridor. No respiratory distress. He has no wheezes. He has no rales. He exhibits no tenderness.  Musculoskeletal: Normal range of motion.  Lymphadenopathy:    He has no cervical adenopathy.  Neurological: He is alert and oriented to person, place, and time.  Skin: Skin is warm, dry and intact. Capillary refill takes less than 2 seconds. No rash noted. He is not diaphoretic. No erythema. No pallor.  Psychiatric: He has a normal mood and affect. His speech is normal and behavior is normal. Judgment and thought content normal. Cognition and memory are normal.    Results for orders placed or performed in visit on 10/07/17  CBC with Differential/Platelet  Result Value Ref Range   WBC 6.7 3.4 - 10.8 x10E3/uL   RBC 4.39 4.14 - 5.80 x10E6/uL   Hemoglobin  13.6 13.0 - 17.7 g/dL   Hematocrit 38.0 37.5 - 51.0 %   MCV 87 79 - 97 fL   MCH 31.0 26.6 - 33.0 pg   MCHC 35.8 (H) 31.5 - 35.7 g/dL   RDW 13.9 12.3 - 15.4 %   Platelets 285 150 - 379 x10E3/uL   Neutrophils 50 Not Estab. %   Lymphs 33 Not Estab. %   Monocytes 16 Not Estab. %   Eos 0 Not Estab. %   Basos 0 Not Estab. %   Neutrophils Absolute 3.4 1.4 - 7.0 x10E3/uL   Lymphocytes Absolute 2.2 0.7 - 3.1 x10E3/uL   Monocytes Absolute  1.0 (H) 0.1 - 0.9 x10E3/uL   EOS (ABSOLUTE) 0.0 0.0 - 0.4 x10E3/uL   Basophils Absolute 0.0 0.0 - 0.2 x10E3/uL   Immature Granulocytes 1 Not Estab. %   Immature Grans (Abs) 0.0 0.0 - 0.1 x10E3/uL  Comprehensive metabolic panel  Result Value Ref Range   Glucose 61 (L) 65 - 99 mg/dL   BUN 19 8 - 27 mg/dL   Creatinine, Ser 1.21 0.76 - 1.27 mg/dL   GFR calc non Af Amer 59 (L) >59 mL/min/1.73   GFR calc Af Amer 68 >59 mL/min/1.73   BUN/Creatinine Ratio 16 10 - 24   Sodium 141 134 - 144 mmol/L   Potassium 3.3 (L) 3.5 - 5.2 mmol/L   Chloride 98 96 - 106 mmol/L   CO2 24 20 - 29 mmol/L   Calcium 10.0 8.6 - 10.2 mg/dL   Total Protein 7.6 6.0 - 8.5 g/dL   Albumin 4.6 3.5 - 4.8 g/dL   Globulin, Total 3.0 1.5 - 4.5 g/dL   Albumin/Globulin Ratio 1.5 1.2 - 2.2   Bilirubin Total 0.6 0.0 - 1.2 mg/dL   Alkaline Phosphatase 88 39 - 117 IU/L   AST 17 0 - 40 IU/L   ALT 12 0 - 44 IU/L  Lipid Panel w/o Chol/HDL Ratio  Result Value Ref Range   Cholesterol, Total 215 (H) 100 - 199 mg/dL   Triglycerides 115 0 - 149 mg/dL   HDL 65 >39 mg/dL   VLDL Cholesterol Cal 23 5 - 40 mg/dL   LDL Calculated 127 (H) 0 - 99 mg/dL  Microalbumin, Urine Waived  Result Value Ref Range   Microalb, Ur Waived 30 (H) 0 - 19 mg/L   Creatinine, Urine Waived 200 10 - 300 mg/dL   Microalb/Creat Ratio <30 <30 mg/g  TSH  Result Value Ref Range   TSH 4.350 0.450 - 4.500 uIU/mL  UA/M w/rflx Culture, Routine  Result Value Ref Range   Specific Gravity, UA 1.010 1.005 - 1.030   pH, UA 5.5 5.0 - 7.5   Color, UA Yellow Yellow   Appearance Ur Clear Clear   Leukocytes, UA Negative Negative   Protein, UA Negative Negative/Trace   Glucose, UA Negative Negative   Ketones, UA Negative Negative   RBC, UA Negative Negative   Bilirubin, UA Negative Negative   Urobilinogen, Ur 0.2 0.2 - 1.0 mg/dL   Nitrite, UA Negative Negative  PSA  Result Value Ref Range   Prostate Specific Ag, Serum 0.6 0.0 - 4.0 ng/mL      Assessment &  Plan:   Problem List Items Addressed This Visit      Nervous and Auditory   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 Doing well. Continue to monitor.         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 Continue current regimen. Continue to monitor. Call with any concerns. Refills given.        Relevant Orders   Comprehensive metabolic panel   Chronic kidney disease    Rechecking levels today. Await results.       Relevant Orders   Comprehensive metabolic panel     Other   Hyperlipidemia    Under good control on current regimen. Continue current regimen. Continue to monitor. Call with any concerns. Refills given.        Relevant Medications   amLODipine (NORVASC) 10 MG tablet   hydrochlorothiazide (HYDRODIURIL) 25 MG tablet   Other Relevant Orders   Comprehensive metabolic panel   Lipid Panel w/o Chol/HDL Ratio    Other Visit Diagnoses    Flu vaccine need       Given today   Relevant Orders   Flu vaccine HIGH DOSE PF (Completed)   Cold feeling       Will check labs. Await results. Call with any concerns.    Relevant Orders   CBC with Differential/Platelet   TSH   Immunization due       Pneumovax given today.   Relevant Orders   Pneumococcal polysaccharide vaccine 23-valent greater than or equal to 2yo subcutaneous/IM   Cough       Will start on inhaler. Call with any concerns. Continue to monitor.        Follow up plan: Return in about 6 months (around 10/10/2018) for Physical/Wellness.

## 2018-04-11 NOTE — Patient Instructions (Signed)
Pneumococcal Polysaccharide Vaccine: What You Need to Know 1. Why get vaccinated? Vaccination can protect older adults (and some children and younger adults) from pneumococcal disease. Pneumococcal disease is caused by bacteria that can spread from person to person through close contact. It can cause ear infections, and it can also lead to more serious infections of the:  Lungs (pneumonia),  Blood (bacteremia), and  Covering of the brain and spinal cord (meningitis). Meningitis can cause deafness and brain damage, and it can be fatal.  Anyone can get pneumococcal disease, but children under 52 years of age, people with certain medical conditions, adults over 104 years of age, and cigarette smokers are at the highest risk. About 18,000 older adults die each year from pneumococcal disease in the [place]. Treatment of pneumococcal infections with penicillin and other drugs used to be more effective. But some strains of the disease have become resistant to these drugs. This makes prevention of the disease, through vaccination, even more important. 2. Pneumococcal polysaccharide vaccine (PPSV23) Pneumococcal polysaccharide vaccine (PPSV23) protects against 23 types of pneumococcal bacteria. It will not prevent all pneumococcal disease. PPSV23 is recommended for:  All adults 31 years of age and older,  Anyone 2 through 75 years of age with certain long-term health problems,  Anyone 2 through 75 years of age with a weakened immune system,  Adults 35 through 75 years of age who smoke cigarettes or have asthma.  Most people need only one dose of PPSV. A second dose is recommended for certain high-risk groups. People 73 and older should get a dose even if they have gotten one or more doses of the vaccine before they turned 65. Your healthcare provider can give you more information about these recommendations. Most healthy adults develop protection within 2 to 3 weeks of getting the shot. 3.  Some people should not get this vaccine  Anyone who has had a life-threatening allergic reaction to PPSV should not get another dose.  Anyone who has a severe allergy to any component of PPSV should not receive it. Tell your provider if you have any severe allergies.  Anyone who is moderately or severely ill when the shot is scheduled may be asked to wait until they recover before getting the vaccine. Someone with a mild illness can usually be vaccinated.  Children less than 64 years of age should not receive this vaccine.  There is no evidence that PPSV is harmful to either a pregnant woman or to her fetus. However, as a precaution, women who need the vaccine should be vaccinated before becoming pregnant, if possible. 4. Risks of a vaccine reaction With any medicine, including vaccines, there is a chance of side effects. These are usually mild and go away on their own, but serious reactions are also possible. About half of people who get PPSV have mild side effects, such as redness or pain where the shot is given, which go away within about two days. Less than 1 out of 100 people develop a fever, muscle aches, or more severe local reactions. Problems that could happen after any vaccine:  People sometimes faint after a medical procedure, including vaccination. Sitting or lying down for about 15 minutes can help prevent fainting, and injuries caused by a fall. Tell your doctor if you feel dizzy, or have vision changes or ringing in the ears.  Some people get severe pain in the shoulder and have difficulty moving the arm where a shot was given. This happens very rarely.  Any  medication can cause a severe allergic reaction. Such reactions from a vaccine are very rare, estimated at about 1 in a million doses, and would happen within a few minutes to a few hours after the vaccination. As with any medicine, there is a very remote chance of a vaccine causing a serious injury or death. The safety of  vaccines is always being monitored. For more information, visit: http://www.aguilar.org/ 5. What if there is a serious reaction? What should I look for? Look for anything that concerns you, such as signs of a severe allergic reaction, very high fever, or unusual behavior. Signs of a severe allergic reaction can include hives, swelling of the face and throat, difficulty breathing, a fast heartbeat, dizziness, and weakness. These would usually start a few minutes to a few hours after the vaccination. What should I do? If you think it is a severe allergic reaction or other emergency that can't wait, call 9-1-1 or get to the nearest hospital. Otherwise, call your doctor. Afterward, the reaction should be reported to the Vaccine Adverse Event Reporting System (VAERS). Your doctor might file this report, or you can do it yourself through the VAERS web site at www.vaers.SamedayNews.es, or by calling 873-168-4603. VAERS does not give medical advice. 6. How can I learn more?  Ask your doctor. He or she can give you the vaccine package insert or suggest other sources of information.  Call your local or state health department.  Contact the Centers for Disease Control and Prevention (CDC): ? Call (712)740-2354 (1-800-CDC-INFO) or ? Visit CDC's website at http://hunter.com/ CDC Pneumococcal Polysaccharide Vaccine VIS (11/10/13) This information is not intended to replace advice given to you by your health care provider. Make sure you discuss any questions you have with your health care provider. Document Released: 05/03/2006 Document Revised: 03/26/2016 Document Reviewed: 03/26/2016 Elsevier Interactive Patient Education  2017 [place]. Influenza (Flu) Vaccine (Inactivated or Recombinant): What You Need to Know 1. Why get vaccinated? Influenza ("flu") is a contagious disease that spreads around the [place] every year, usually between October and May. Flu is caused by influenza viruses, and is  spread mainly by coughing, sneezing, and close contact. Anyone can get flu. Flu strikes suddenly and can last several days. Symptoms vary by age, but can include:  fever/chills  sore throat  muscle aches  fatigue  cough  headache  runny or stuffy nose  Flu can also lead to pneumonia and blood infections, and cause diarrhea and seizures in children. If you have a medical condition, such as heart or lung disease, flu can make it worse. Flu is more dangerous for some people. Infants and young children, people 61 years of age and older, pregnant women, and people with certain health conditions or a weakened immune system are at greatest risk. Each year thousands of people in the [place] die from flu, and many more are hospitalized. Flu vaccine can:  keep you from getting flu,  make flu less severe if you do get it, and  keep you from spreading flu to your family and other people. 2. Inactivated and recombinant flu vaccines A dose of flu vaccine is recommended every flu season. Children 6 months through 75 years of age may need two doses during the same flu season. Everyone else needs only one dose each flu season. Some inactivated flu vaccines contain a very small amount of a mercury-based preservative called thimerosal. Studies have not shown thimerosal in vaccines to be harmful, but flu vaccines that do  not contain thimerosal are available. There is no live flu virus in flu shots. They cannot cause the flu. There are many flu viruses, and they are always changing. Each year a new flu vaccine is made to protect against three or four viruses that are likely to cause disease in the upcoming flu season. But even when the vaccine doesn't exactly match these viruses, it may still provide some protection. Flu vaccine cannot prevent:  flu that is caused by a virus not covered by the vaccine, or  illnesses that look like flu but are not.  It takes about 2 weeks for protection to  develop after vaccination, and protection lasts through the flu season. 3. Some people should not get this vaccine Tell the person who is giving you the vaccine:  If you have any severe, life-threatening allergies. If you ever had a life-threatening allergic reaction after a dose of flu vaccine, or have a severe allergy to any part of this vaccine, you may be advised not to get vaccinated. Most, but not all, types of flu vaccine contain a small amount of egg protein.  If you ever had Guillain-Barr Syndrome (also called GBS). Some people with a history of GBS should not get this vaccine. This should be discussed with your doctor.  If you are not feeling well. It is usually okay to get flu vaccine when you have a mild illness, but you might be asked to come back when you feel better.  4. Risks of a vaccine reaction With any medicine, including vaccines, there is a chance of reactions. These are usually mild and go away on their own, but serious reactions are also possible. Most people who get a flu shot do not have any problems with it. Minor problems following a flu shot include:  soreness, redness, or swelling where the shot was given  hoarseness  sore, red or itchy eyes  cough  fever  aches  headache  itching  fatigue  If these problems occur, they usually begin soon after the shot and last 1 or 2 days. More serious problems following a flu shot can include the following:  There may be a small increased risk of Guillain-Barre Syndrome (GBS) after inactivated flu vaccine. This risk has been estimated at 1 or 2 additional cases per million people vaccinated. This is much lower than the risk of severe complications from flu, which can be prevented by flu vaccine.  Young children who get the flu shot along with pneumococcal vaccine (PCV13) and/or DTaP vaccine at the same time might be slightly more likely to have a seizure caused by fever. Ask your doctor for more information.  Tell your doctor if a child who is getting flu vaccine has ever had a seizure.  Problems that could happen after any injected vaccine:  People sometimes faint after a medical procedure, including vaccination. Sitting or lying down for about 15 minutes can help prevent fainting, and injuries caused by a fall. Tell your doctor if you feel dizzy, or have vision changes or ringing in the ears.  Some people get severe pain in the shoulder and have difficulty moving the arm where a shot was given. This happens very rarely.  Any medication can cause a severe allergic reaction. Such reactions from a vaccine are very rare, estimated at about 1 in a million doses, and would happen within a few minutes to a few hours after the vaccination. As with any medicine, there is a very remote chance of a  vaccine causing a serious injury or death. The safety of vaccines is always being monitored. For more information, visit: www.cdc.gov/vaccinesafety/ 5. What if there is a serious reaction? What should I look for? Look for anything that concerns you, such as signs of a severe allergic reaction, very high fever, or unusual behavior. Signs of a severe allergic reaction can include hives, swelling of the face and throat, difficulty breathing, a fast heartbeat, dizziness, and weakness. These would start a few minutes to a few hours after the vaccination. What should I do?  If you think it is a severe allergic reaction or other emergency that can't wait, call 9-1-1 and get the person to the nearest hospital. Otherwise, call your doctor.  Reactions should be reported to the Vaccine Adverse Event Reporting System (VAERS). Your doctor should file this report, or you can do it yourself through the VAERS web site at www.vaers.hhs.gov, or by calling 1-800-822-7967. ? VAERS does not give medical advice. 6. The National Vaccine Injury Compensation Program The National Vaccine Injury Compensation Program (VICP) is a federal  program that was created to compensate people who may have been injured by certain vaccines. Persons who believe they may have been injured by a vaccine can learn about the program and about filing a claim by calling 1-800-338-2382 or visiting the VICP website at www.hrsa.gov/vaccinecompensation. There is a time limit to file a claim for compensation. 7. How can I learn more?  Ask your healthcare provider. He or she can give you the vaccine package insert or suggest other sources of information.  Call your local or state health department.  Contact the Centers for Disease Control and Prevention (CDC): ? Call 1-800-232-4636 (1-800-CDC-INFO) or ? Visit CDC's website at www.cdc.gov/flu Vaccine Information Statement, Inactivated Influenza Vaccine (02/23/2014) This information is not intended to replace advice given to you by your health care provider. Make sure you discuss any questions you have with your health care provider. Document Released: 04/30/2006 Document Revised: 03/26/2016 Document Reviewed: 03/26/2016 Elsevier Interactive Patient Education  2017 [employer]  

## 2018-04-11 NOTE — Assessment & Plan Note (Signed)
Under good control on current regimen. Continue current regimen. Continue to monitor. Call with any concerns. Refills given.   

## 2018-04-11 NOTE — Assessment & Plan Note (Signed)
In a facility. Doing well. Continue to monitor.

## 2018-04-11 NOTE — Assessment & Plan Note (Signed)
Rechecking levels today. Await results.  

## 2018-04-12 LAB — CBC WITH DIFFERENTIAL/PLATELET
BASOS ABS: 0 10*3/uL (ref 0.0–0.2)
BASOS: 0 %
EOS (ABSOLUTE): 0 10*3/uL (ref 0.0–0.4)
Eos: 0 %
Hematocrit: 37.2 % — ABNORMAL LOW (ref 37.5–51.0)
Hemoglobin: 13.3 g/dL (ref 13.0–17.7)
IMMATURE GRANULOCYTES: 1 %
Immature Grans (Abs): 0 10*3/uL (ref 0.0–0.1)
Lymphocytes Absolute: 2.1 10*3/uL (ref 0.7–3.1)
Lymphs: 27 %
MCH: 30.4 pg (ref 26.6–33.0)
MCHC: 35.8 g/dL — ABNORMAL HIGH (ref 31.5–35.7)
MCV: 85 fL (ref 79–97)
MONOS ABS: 1.1 10*3/uL — AB (ref 0.1–0.9)
Monocytes: 14 %
NEUTROS ABS: 4.6 10*3/uL (ref 1.4–7.0)
NEUTROS PCT: 58 %
Platelets: 210 10*3/uL (ref 150–450)
RBC: 4.37 x10E6/uL (ref 4.14–5.80)
RDW: 13 % (ref 12.3–15.4)
WBC: 7.9 10*3/uL (ref 3.4–10.8)

## 2018-04-12 LAB — COMPREHENSIVE METABOLIC PANEL
ALBUMIN: 4.7 g/dL (ref 3.5–4.8)
ALT: 12 IU/L (ref 0–44)
AST: 22 IU/L (ref 0–40)
Albumin/Globulin Ratio: 1.5 (ref 1.2–2.2)
Alkaline Phosphatase: 77 IU/L (ref 39–117)
BILIRUBIN TOTAL: 0.9 mg/dL (ref 0.0–1.2)
BUN/Creatinine Ratio: 19 (ref 10–24)
BUN: 30 mg/dL — ABNORMAL HIGH (ref 8–27)
CHLORIDE: 96 mmol/L (ref 96–106)
CO2: 25 mmol/L (ref 20–29)
CREATININE: 1.6 mg/dL — AB (ref 0.76–1.27)
Calcium: 10.2 mg/dL (ref 8.6–10.2)
GFR calc Af Amer: 48 mL/min/{1.73_m2} — ABNORMAL LOW (ref >59)
GFR calc non Af Amer: 42 mL/min/{1.73_m2} — ABNORMAL LOW (ref >59)
GLOBULIN, TOTAL: 3.2 g/dL (ref 1.5–4.5)
Glucose: 98 mg/dL (ref 65–99)
POTASSIUM: 3.4 mmol/L — AB (ref 3.5–5.2)
SODIUM: 141 mmol/L (ref 134–144)
Total Protein: 7.9 g/dL (ref 6.0–8.5)

## 2018-04-12 LAB — LIPID PANEL W/O CHOL/HDL RATIO
Cholesterol, Total: 219 mg/dL — ABNORMAL HIGH (ref 100–199)
HDL: 48 mg/dL (ref >39)
LDL CALC: 141 mg/dL — AB (ref 0–99)
Triglycerides: 148 mg/dL (ref 0–149)
VLDL CHOLESTEROL CAL: 30 mg/dL (ref 5–40)

## 2018-04-12 LAB — TSH: TSH: 2.69 u[IU]/mL (ref 0.450–4.500)

## 2018-04-13 ENCOUNTER — Telehealth: Payer: Self-pay | Admitting: Family Medicine

## 2018-04-13 DIAGNOSIS — N183 Chronic kidney disease, stage 3 unspecified: Secondary | ICD-10-CM

## 2018-04-13 NOTE — Telephone Encounter (Signed)
Called [facility] @ [phone] and spoke with [place]. Verbalized understanding and denied question. Will bring pt by in 2 weeks for labs.

## 2018-04-13 NOTE — Telephone Encounter (Signed)
Please let the facility know that his kidney function got a bit worse. I'd like him to push fluids and come back in for a recheck on his BMP in 2 weeks. Order in. Thanks!

## 2018-08-08 ENCOUNTER — Encounter: Payer: Self-pay | Admitting: Family Medicine

## 2018-10-10 ENCOUNTER — Encounter: Payer: Medicare Other | Admitting: Family Medicine

## 2018-10-24 ENCOUNTER — Encounter: Payer: Medicare Other | Admitting: Family Medicine

## 2019-01-19 ENCOUNTER — Ambulatory Visit (INDEPENDENT_AMBULATORY_CARE_PROVIDER_SITE_OTHER): Payer: Medicare Other | Admitting: Family Medicine

## 2019-01-19 ENCOUNTER — Ambulatory Visit: Payer: Medicare Other

## 2019-01-19 ENCOUNTER — Encounter: Payer: Self-pay | Admitting: Family Medicine

## 2019-01-19 ENCOUNTER — Other Ambulatory Visit: Payer: Self-pay

## 2019-01-19 VITALS — BP 114/79 | HR 109 | Temp 98.5°F | Ht 67.0 in | Wt 128.0 lb

## 2019-01-19 DIAGNOSIS — Z0000 Encounter for general adult medical examination without abnormal findings: Secondary | ICD-10-CM | POA: Diagnosis not present

## 2019-01-19 DIAGNOSIS — I129 Hypertensive chronic kidney disease with stage 1 through stage 4 chronic kidney disease, or unspecified chronic kidney disease: Secondary | ICD-10-CM | POA: Diagnosis not present

## 2019-01-19 DIAGNOSIS — N183 Chronic kidney disease, stage 3 unspecified: Secondary | ICD-10-CM

## 2019-01-19 DIAGNOSIS — F1027 Alcohol dependence with alcohol-induced persisting dementia: Secondary | ICD-10-CM

## 2019-01-19 DIAGNOSIS — L821 Other seborrheic keratosis: Secondary | ICD-10-CM

## 2019-01-19 DIAGNOSIS — R3911 Hesitancy of micturition: Secondary | ICD-10-CM

## 2019-01-19 DIAGNOSIS — E782 Mixed hyperlipidemia: Secondary | ICD-10-CM

## 2019-01-19 LAB — UA/M W/RFLX CULTURE, ROUTINE
Bilirubin, UA: NEGATIVE
Glucose, UA: NEGATIVE
Ketones, UA: NEGATIVE
Leukocytes,UA: NEGATIVE
Nitrite, UA: NEGATIVE
Protein,UA: NEGATIVE
RBC, UA: NEGATIVE
Specific Gravity, UA: 1.015 (ref 1.005–1.030)
Urobilinogen, Ur: 1 mg/dL (ref 0.2–1.0)
pH, UA: 6.5 (ref 5.0–7.5)

## 2019-01-19 LAB — MICROALBUMIN, URINE WAIVED
Creatinine, Urine Waived: 100 mg/dL (ref 10–300)
Microalb, Ur Waived: 30 mg/L — ABNORMAL HIGH (ref 0–19)
Microalb/Creat Ratio: <30 mg/g (ref <30)

## 2019-01-19 MED ORDER — HYDROCHLOROTHIAZIDE 25 MG PO TABS: 25.0000 mg | ORAL_TABLET | Freq: Every day | ORAL | 3 refills | Status: DC

## 2019-01-19 MED ORDER — KETOCONAZOLE 2 % EX CREA: 1.0000 "application " | TOPICAL_CREAM | Freq: Every day | CUTANEOUS | 3 refills | Status: DC

## 2019-01-19 MED ORDER — AMLODIPINE BESYLATE 10 MG PO TABS: 10.0000 mg | ORAL_TABLET | Freq: Every day | ORAL | 3 refills | Status: DC

## 2019-01-19 NOTE — Assessment & Plan Note (Addendum)
Overtreated. Getting dizzy. Will stop HCTZ and recheck 1 month. Call with any concerns.

## 2019-01-19 NOTE — Patient Instructions (Addendum)
Preventative Services:  Health Risk Assessment and Personalized Prevention Plan: Done today Bone Mass Measurements: N/A CVD Screening: Done today Colon Cancer Screening: N/A Depression Screening: Done today Diabetes Screening: Done today Glaucoma Screening: See your eye doctor Hepatitis B vaccine: N/a Hepatitis C screening:N/A HIV Screening: N/A Flu Vaccine: Get in the fall Lung cancer Screening: N/A Obesity Screening: Done today Pneumonia Vaccines (2): Up to date STI Screening: N/A PSA screening: Done today   Health Maintenance After Age 81 After age 36, you are at a higher risk for certain long-term diseases and infections as well as injuries from falls. Falls are a major cause of broken bones and head injuries in people who are older than age 12. Getting regular preventive care can help to keep you healthy and well. Preventive care includes getting regular testing and making lifestyle changes as recommended by your health care provider. Talk with your health care provider about:  Which screenings and tests you should have. A screening is a test that checks for a disease when you have no symptoms.  A diet and exercise plan that is right for you. What should I know about screenings and tests to prevent falls? Screening and testing are the best ways to find a health problem early. Early diagnosis and treatment give you the best chance of managing medical conditions that are common after age 1. Certain conditions and lifestyle choices may make you more likely to have a fall. Your health care provider may recommend:  Regular vision checks. Poor vision and conditions such as cataracts can make you more likely to have a fall. If you wear glasses, make sure to get your prescription updated if your vision changes.  Medicine review. Work with your health care provider to regularly review all of the medicines you are taking, including over-the-counter medicines. Ask your health care provider  about any side effects that may make you more likely to have a fall. Tell your health care provider if any medicines that you take make you feel dizzy or sleepy.  Osteoporosis screening. Osteoporosis is a condition that causes the bones to get weaker. This can make the bones weak and cause them to break more easily.  Blood pressure screening. Blood pressure changes and medicines to control blood pressure can make you feel dizzy.  Strength and balance checks. Your health care provider may recommend certain tests to check your strength and balance while standing, walking, or changing positions.  Foot health exam. Foot pain and numbness, as well as not wearing proper footwear, can make you more likely to have a fall.  Depression screening. You may be more likely to have a fall if you have a fear of falling, feel emotionally low, or feel unable to do activities that you used to do.  Alcohol use screening. Using too much alcohol can affect your balance and may make you more likely to have a fall. What actions can I take to lower my risk of falls? General instructions  Talk with your health care provider about your risks for falling. Tell your health care provider if: ? You fall. Be sure to tell your health care provider about all falls, even ones that seem minor. ? You feel dizzy, sleepy, or off-balance.  Take over-the-counter and prescription medicines only as told by your health care provider. These include any supplements.  Eat a healthy diet and maintain a healthy weight. A healthy diet includes low-fat dairy products, low-fat (lean) meats, and fiber from whole grains, beans, and  lots of fruits and vegetables. Home safety  Remove any tripping hazards, such as rugs, cords, and clutter.  Install safety equipment such as grab bars in bathrooms and safety rails on stairs.  Keep rooms and walkways well-lit. Activity   Follow a regular exercise program to stay fit. This will help you  maintain your balance. Ask your health care provider what types of exercise are appropriate for you.  If you need a cane or walker, use it as recommended by your health care provider.  Wear supportive shoes that have nonskid soles. Lifestyle  Do not drink alcohol if your health care provider tells you not to drink.  If you drink alcohol, limit how much you have: ? 0-1 drink a day for women. ? 0-2 drinks a day for men.  Be aware of how much alcohol is in your drink. In the [place] one drink equals one typical bottle of beer (12 oz), one-half glass of wine (5 oz), or one shot of hard liquor (1 oz).  Do not use any products that contain nicotine or tobacco, such as cigarettes and e-cigarettes. If you need help quitting, ask your health care provider. Summary  Having a healthy lifestyle and getting preventive care can help to protect your health and wellness after age 38.  Screening and testing are the best way to find a health problem early and help you avoid having a fall. Early diagnosis and treatment give you the best chance for managing medical conditions that are more common for people who are older than age 54.  Falls are a major cause of broken bones and head injuries in people who are older than age 59. Take precautions to prevent a fall at home.  Work with your health care provider to learn what changes you can make to improve your health and wellness and to prevent falls. This information is not intended to replace advice given to you by your health care provider. Make sure you discuss any questions you have with your health care provider. Document Released: 05/19/2017 Document Revised: 10/27/2018 Document Reviewed: 05/19/2017 Elsevier Patient Education  2020 [employer].

## 2019-01-19 NOTE — Assessment & Plan Note (Signed)
Rechecking levels today. Continue to monitor. Call with any concerns.  

## 2019-01-19 NOTE — Progress Notes (Signed)
BP 114/79   Pulse (!) 109   Temp 98.5 F (36.9 C) (Oral)   Ht 5\' 7"  (1.702 m)   Wt 128 lb (58.1 kg)   SpO2 98%   BMI 20.05 kg/m    Subjective:    Patient ID: Chad Banks, male    DOB: Feb 17, 1941, 78 y.o.   MRN: [id number]  HPI: Chad Banks is a 78 y.o. male presenting on 01/19/2019 for comprehensive medical examination. Current medical complaints include:  HYPERTENSION / HYPERLIPIDEMIA Satisfied with current treatment? yes Duration of hypertension: chronic BP monitoring frequency: rarely BP range:  BP medication side effects: no Past BP meds: amlodipine, HCTZ Duration of hyperlipidemia: chronic Cholesterol medication side effects: no Cholesterol supplements: none Past cholesterol medications: none Medication compliance: excellent compliance Aspirin: no Recent stressors: no Recurrent headaches: no Visual changes: no Palpitations: no Dyspnea: no Chest pain: no Lower extremity edema: no Dizzy/lightheaded: yes  RASH Duration:  weeks  Location: back of the neck  Itching: yes Burning: no Redness: no Oozing: no Scaling: no Blisters: no Painful: no Fevers: no Change in detergents/soaps/personal care products: no Recent illness: no Recent travel:no History of same: yes Context: stable Alleviating factors: nothing Treatments attempted:lotion/moisturizer Shortness of breath: no  Throat/tongue swelling: no Myalgias/arthralgias: no  He currently lives with: In assisted living Interim Problems from his last visit: no  Functional Status Survey: Is the patient deaf or have difficulty hearing?: No Does the patient have difficulty seeing, even when wearing glasses/contacts?: Yes Does the patient have difficulty concentrating, remembering, or making decisions?: Yes Does the patient have difficulty walking or climbing stairs?: No Does the patient have difficulty dressing or bathing?: No Does the patient have difficulty doing errands alone such as visiting a  doctor's office or shopping?: No  FALL RISK: Fall Risk  01/19/2019 10/07/2017 04/21/2017 12/26/2014  Falls in the past year? 0 No No No  Number falls in past yr: 0 - - -  Injury with Fall? 0 - - -    Depression Screen Depression screen [facility] 2/9 01/19/2019 10/07/2017 04/21/2017 12/26/2014  Decreased Interest 0 0 0 0  Down, Depressed, Hopeless 0 0 0 0  PHQ - 2 Score 0 0 0 0  Altered sleeping 0 - - -  Tired, decreased energy 1 - - -  Change in appetite 0 - - -  Feeling bad or failure about yourself  0 - - -  Trouble concentrating 0 - - -  Moving slowly or fidgety/restless 0 - - -  Suicidal thoughts 0 - - -  PHQ-9 Score 1 - - -  Difficult doing work/chores Not difficult at all - - -    Advanced Directives <no information>  Past Medical History:  Past Medical History:  Diagnosis Date  . 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 Chronic kidney disease   . COPD (chronic obstructive pulmonary disease) ([place])   . Eye injuries    multiple, Under care of Opthamology, Right Eye  . Hypertension   . Metabolic encephalopathy   . 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 No pertinent surgical history.  Medications:  Current Outpatient Medications on File Prior to Visit  Medication Sig  . memantine (NAMENDA XR) 28 MG CP24 24 hr capsule    No current facility-administered medications on file prior to visit.     Allergies:  Allergies  Allergen Reactions  . Lisinopril     Chest Pain    Social  History:  Social History   Socioeconomic History  . Marital status: Single    Spouse name: Not on file  . Number of children: Not on file  . Years of education: Not on file  . Highest education level: Not on file  Occupational History  . Not on file  Social Needs  . Financial resource strain: Not hard at all  . Food insecurity    Worry: Never true    Inability: Never true  . Transportation needs    Medical: No    Non-medical: No  Tobacco Use  .  Smoking status: Former Smoker    Quit date: 07/20/2014    Years since quitting: 4.5  . Smokeless tobacco: Never Used  Substance and Sexual Activity  . Alcohol use: Not Currently  . Drug use: No  . Sexual activity: Not on file  Lifestyle  . Physical activity    Days per week: 0 days    Minutes per session: 0 min  . Stress: Not at all  Relationships  . Social Herbalist on phone: Never    Gets together: More than three times a week    Attends religious service: Never    Active member of club or organization: No    Attends meetings of clubs or organizations: Never    Relationship status: Never married  . Intimate partner violence    Fear of current or ex partner: No    Emotionally abused: No    Physically abused: No    Forced sexual activity: No  Other Topics Concern  . Not on file  Social History Narrative  . Not on file   Social History   Tobacco Use  Smoking Status Former Smoker  . Quit date: 07/20/2014  . Years since quitting: 4.5  Smokeless Tobacco Never Used   Social History   Substance and Sexual Activity  Alcohol Use Not Currently    Family History:  Family History  Problem Relation Age of Onset  . Arthritis Mother   . Hypertension Sister   . Alcohol abuse Brother   . Asthma Brother   . Birth defects Brother     Past medical history, surgical history, medications, allergies, family history and social history reviewed with patient today and changes made to appropriate areas of the chart.   Review of Systems  Constitutional: Negative.   HENT: Negative.   Eyes: Positive for blurred vision. Negative for double vision, photophobia, pain, discharge and redness.  Respiratory: Negative.   Cardiovascular: Negative.   Gastrointestinal: Negative.  Negative for abdominal pain, blood in stool, constipation, diarrhea, heartburn, melena, nausea and vomiting.  Genitourinary: Negative.   Musculoskeletal: Negative.   Skin: Positive for rash. Negative for  itching.  Neurological: Positive for dizziness. Negative for tingling, tremors, sensory change, speech change, focal weakness, seizures, loss of consciousness, weakness and headaches.  Endo/Heme/Allergies: Negative.   Psychiatric/Behavioral: Negative.     All other ROS negative except what is listed above and in the HPI.      Objective:    BP 114/79   Pulse (!) 109   Temp 98.5 F (36.9 C) (Oral)   Ht 5\' 7"  (1.702 m)   Wt 128 lb (58.1 kg)   SpO2 98%   BMI 20.05 kg/m   Wt Readings from Last 3 Encounters:  01/19/19 128 lb (58.1 kg)  04/11/18 136 lb (61.7 kg)  10/07/17 134 lb (60.8 kg)    Physical Exam Vitals signs and nursing note reviewed.  Constitutional:      General: He is not in acute distress.    Appearance: Normal appearance. He is obese. He is not ill-appearing, toxic-appearing or diaphoretic.  HENT:     Head: Normocephalic and atraumatic.     Right Ear: Tympanic membrane, ear canal and external ear normal. There is no impacted cerumen.     Left Ear: Tympanic membrane, ear canal and external ear normal. There is no impacted cerumen.     Nose: Nose normal. No congestion or rhinorrhea.     Mouth/Throat:     Mouth: Mucous membranes are moist.     Pharynx: Oropharynx is clear. No oropharyngeal exudate or posterior oropharyngeal erythema.  Eyes:     General: No scleral icterus.       Right eye: No discharge.        Left eye: No discharge.     Extraocular Movements: Extraocular movements intact.     Pupils: Pupils are equal, round, and reactive to light.     Comments: Clouding R eye  Neck:     Musculoskeletal: Normal range of motion and neck supple. No neck rigidity or muscular tenderness.     Vascular: No carotid bruit.  Cardiovascular:     Rate and Rhythm: Normal rate and regular rhythm.     Pulses: Normal pulses.     Heart sounds: No murmur. No friction rub. No gallop.   Pulmonary:     Effort: Pulmonary effort is normal. No respiratory distress.     Breath  sounds: Normal breath sounds. No stridor. No wheezing, rhonchi or rales.  Chest:     Chest wall: No tenderness.  Abdominal:     General: Abdomen is flat. Bowel sounds are normal. There is no distension.     Palpations: Abdomen is soft. There is no mass.     Tenderness: There is no abdominal tenderness. There is no right CVA tenderness, left CVA tenderness, guarding or rebound.     Hernia: No hernia is present.  Genitourinary:    Comments: Genital exam deferred with shared decision making Musculoskeletal:        General: No swelling, tenderness, deformity or signs of injury.     Right lower leg: No edema.     Left lower leg: No edema.  Lymphadenopathy:     Cervical: No cervical adenopathy.  Skin:    General: Skin is warm and dry.     Capillary Refill: Capillary refill takes less than 2 seconds.     Coloration: Skin is not jaundiced or pale.     Findings: Rash (seborrheic keratosis at the back of his neck) present. No bruising, erythema or lesion.  Neurological:     General: No focal deficit present.     Mental Status: He is alert and oriented to person, place, and time.     Cranial Nerves: No cranial nerve deficit.     Sensory: No sensory deficit.     Motor: No weakness.     Coordination: Coordination normal.     Gait: Gait normal.     Deep Tendon Reflexes: Reflexes normal.  Psychiatric:        Mood and Affect: Mood normal.        Behavior: Behavior normal.        Thought Content: Thought content normal.        Judgment: Judgment normal.     6CIT Screen 01/19/2019 10/07/2017  What Year? 4 points 4 points  What month? 3 points 3 points  What time? 0 points 3 points  Count back from 20 (No Data) 2 points  Months in reverse 4 points 4 points  Repeat phrase 10 points 10 points  Total Score - 26     Results for orders placed or performed in visit on 04/11/18  Comprehensive metabolic panel  Result Value Ref Range   Glucose 98 65 - 99 mg/dL   BUN 30 (H) 8 - 27 mg/dL    Creatinine, Ser 1.60 (H) 0.76 - 1.27 mg/dL   GFR calc non Af Amer 42 (L) >59 mL/min/1.73   GFR calc Af Amer 48 (L) >59 mL/min/1.73   BUN/Creatinine Ratio 19 10 - 24   Sodium 141 134 - 144 mmol/L   Potassium 3.4 (L) 3.5 - 5.2 mmol/L   Chloride 96 96 - 106 mmol/L   CO2 25 20 - 29 mmol/L   Calcium 10.2 8.6 - 10.2 mg/dL   Total Protein 7.9 6.0 - 8.5 g/dL   Albumin 4.7 3.5 - 4.8 g/dL   Globulin, Total 3.2 1.5 - 4.5 g/dL   Albumin/Globulin Ratio 1.5 1.2 - 2.2   Bilirubin Total 0.9 0.0 - 1.2 mg/dL   Alkaline Phosphatase 77 39 - 117 IU/L   AST 22 0 - 40 IU/L   ALT 12 0 - 44 IU/L  Lipid Panel w/o Chol/HDL Ratio  Result Value Ref Range   Cholesterol, Total 219 (H) 100 - 199 mg/dL   Triglycerides 148 0 - 149 mg/dL   HDL 48 >39 mg/dL   VLDL Cholesterol Cal 30 5 - 40 mg/dL   LDL Calculated 141 (H) 0 - 99 mg/dL  CBC with Differential/Platelet  Result Value Ref Range   WBC 7.9 3.4 - 10.8 x10E3/uL   RBC 4.37 4.14 - 5.80 x10E6/uL   Hemoglobin 13.3 13.0 - 17.7 g/dL   Hematocrit 37.2 (L) 37.5 - 51.0 %   MCV 85 79 - 97 fL   MCH 30.4 26.6 - 33.0 pg   MCHC 35.8 (H) 31.5 - 35.7 g/dL   RDW 13.0 12.3 - 15.4 %   Platelets 210 150 - 450 x10E3/uL   Neutrophils 58 Not Estab. %   Lymphs 27 Not Estab. %   Monocytes 14 Not Estab. %   Eos 0 Not Estab. %   Basos 0 Not Estab. %   Neutrophils Absolute 4.6 1.4 - 7.0 x10E3/uL   Lymphocytes Absolute 2.1 0.7 - 3.1 x10E3/uL   Monocytes Absolute 1.1 (H) 0.1 - 0.9 x10E3/uL   EOS (ABSOLUTE) 0.0 0.0 - 0.4 x10E3/uL   Basophils Absolute 0.0 0.0 - 0.2 x10E3/uL   Immature Granulocytes 1 Not Estab. 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 Stable. Continue to monitor. Call with any concerns.       Relevant Orders   CBC with Differential/Platelet    Comprehensive metabolic panel   TSH   UA/M w/rflx Culture, Routine     Genitourinary   Benign hypertensive renal disease (Chronic)    Overtreated. Getting dizzy. Will stop HCTZ and recheck 1 month. Call with any concerns.       Relevant Orders   CBC with Differential/Platelet   Comprehensive metabolic panel   Microalbumin, Urine Waived  TSH   UA/M w/rflx Culture, Routine   Chronic kidney disease    Rechecking levels today. Continue to monitor. Call with any concerns.       Relevant Orders   CBC with Differential/Platelet   Comprehensive metabolic panel   Microalbumin, Urine Waived   TSH   UA/M w/rflx Culture, Routine     Other   Hyperlipidemia    Rechecking levels today. Continue to monitor. Call with any concerns.       Relevant Medications   amLODipine (NORVASC) 10 MG tablet   Other Relevant Orders   CBC with Differential/Platelet   Comprehensive metabolic panel   Lipid Panel w/o Chol/HDL Ratio   UA/M w/rflx Culture, Routine    Other Visit Diagnoses    Encounter for Medicare annual wellness exam    -  Primary   Preventative care discussed today as below.    Routine general medical examination at a health care facility       Vaccines up to date. Screening labs checked today. Continue diet and exercise. Call with any concerns.    Hesitancy       Labs drawn today. Await results.    Relevant Orders   PSA   Seborrheic keratosis       Will treat with ketoconazole. Call if not getting better or getting worse.       Preventative Services:  Health Risk Assessment and Personalized Prevention Plan: Done today Bone Mass Measurements: N/A CVD Screening: Done today Colon Cancer Screening: N/A Depression Screening: Done today Diabetes Screening: Done today Glaucoma Screening: See your eye doctor Hepatitis B vaccine: N/a Hepatitis C screening:N/A HIV Screening: N/A Flu Vaccine: Get in the fall Lung cancer Screening: N/A Obesity Screening: Done today Pneumonia  Vaccines (2): Up to date STI Screening: N/A PSA screening: Done today   LABORATORY TESTING:  Health maintenance labs ordered today as discussed above.   The natural history of prostate cancer and ongoing controversy regarding screening and potential treatment outcomes of prostate cancer has been discussed with the patient. The meaning of a false positive PSA and a false negative PSA has been discussed. He indicates understanding of the limitations of this screening test and wishes to proceed with screening PSA testing.   IMMUNIZATIONS:   - Tdap: Tetanus vaccination status reviewed: last tetanus booster within 10 years. - Influenza: Postponed to flu season - Pneumovax: Up to date - Prevnar: Up to date  SCREENING: - Colonoscopy: Not applicable  Discussed with patient purpose of the colonoscopy is to detect colon cancer at curable precancerous or early stages   PATIENT COUNSELING:    Sexuality: Discussed sexually transmitted diseases, partner selection, use of condoms, avoidance of unintended pregnancy  and contraceptive alternatives.   Advised to avoid cigarette smoking.  I discussed with the patient that most people either abstain from alcohol or drink within safe limits (<=14/week and <=4 drinks/occasion for males, <=7/weeks and <= 3 drinks/occasion for females) and that the risk for alcohol disorders and other health effects rises proportionally with the number of drinks per week and how often a drinker exceeds daily limits.  Discussed cessation/primary prevention of drug use and availability of treatment for abuse.   Diet: Encouraged to adjust caloric intake to maintain  or achieve ideal body weight, to reduce intake of dietary saturated fat and total fat, to limit sodium intake by avoiding high sodium foods and not adding table salt, and to maintain adequate dietary potassium and calcium preferably from fresh fruits, vegetables, and low-fat  dairy products.    stressed the  importance of regular exercise  Injury prevention: Discussed safety belts, safety helmets, smoke detector, smoking near bedding or upholstery.   Dental health: Discussed importance of regular tooth brushing, flossing, and dental visits.   Follow up plan: NEXT PREVENTATIVE PHYSICAL DUE IN 1 YEAR. Return in about 4 weeks (around 02/16/2019) for follow up BP.

## 2019-01-19 NOTE — Assessment & Plan Note (Signed)
Has help. Stable. Continue to monitor. Call with any concerns.

## 2019-01-20 LAB — CBC WITH DIFFERENTIAL/PLATELET
Basophils Absolute: 0 10*3/uL (ref 0.0–0.2)
Basos: 0 %
EOS (ABSOLUTE): 0 10*3/uL (ref 0.0–0.4)
Eos: 0 %
Hematocrit: 40.1 % (ref 37.5–51.0)
Hemoglobin: 14.1 g/dL (ref 13.0–17.7)
Immature Grans (Abs): 0.1 10*3/uL (ref 0.0–0.1)
Immature Granulocytes: 1 %
Lymphocytes Absolute: 2.3 10*3/uL (ref 0.7–3.1)
Lymphs: 30 %
MCH: 30.2 pg (ref 26.6–33.0)
MCHC: 35.2 g/dL (ref 31.5–35.7)
MCV: 86 fL (ref 79–97)
Monocytes Absolute: 1.1 10*3/uL — ABNORMAL HIGH (ref 0.1–0.9)
Monocytes: 14 %
Neutrophils Absolute: 4.3 10*3/uL (ref 1.4–7.0)
Neutrophils: 55 %
Platelets: 258 10*3/uL (ref 150–450)
RBC: 4.67 x10E6/uL (ref 4.14–5.80)
RDW: 13.3 % (ref 11.6–15.4)
WBC: 7.8 10*3/uL (ref 3.4–10.8)

## 2019-01-20 LAB — LIPID PANEL W/O CHOL/HDL RATIO
Cholesterol, Total: 218 mg/dL — ABNORMAL HIGH (ref 100–199)
HDL: 54 mg/dL (ref >39)
LDL Calculated: 131 mg/dL — ABNORMAL HIGH (ref 0–99)
Triglycerides: 165 mg/dL — ABNORMAL HIGH (ref 0–149)
VLDL Cholesterol Cal: 33 mg/dL (ref 5–40)

## 2019-01-20 LAB — COMPREHENSIVE METABOLIC PANEL
ALT: 12 IU/L (ref 0–44)
AST: 18 IU/L (ref 0–40)
Albumin/Globulin Ratio: 1.7 (ref 1.2–2.2)
Albumin: 4.8 g/dL — ABNORMAL HIGH (ref 3.7–4.7)
Alkaline Phosphatase: 88 IU/L (ref 39–117)
BUN/Creatinine Ratio: 15 (ref 10–24)
BUN: 27 mg/dL (ref 8–27)
Bilirubin Total: 0.7 mg/dL (ref 0.0–1.2)
CO2: 25 mmol/L (ref 20–29)
Calcium: 10.5 mg/dL — ABNORMAL HIGH (ref 8.6–10.2)
Chloride: 99 mmol/L (ref 96–106)
Creatinine, Ser: 1.76 mg/dL — ABNORMAL HIGH (ref 0.76–1.27)
GFR calc Af Amer: 42 mL/min/{1.73_m2} — ABNORMAL LOW (ref >59)
GFR calc non Af Amer: 37 mL/min/{1.73_m2} — ABNORMAL LOW (ref >59)
Globulin, Total: 2.9 g/dL (ref 1.5–4.5)
Glucose: 96 mg/dL (ref 65–99)
Potassium: 3.4 mmol/L — ABNORMAL LOW (ref 3.5–5.2)
Sodium: 141 mmol/L (ref 134–144)
Total Protein: 7.7 g/dL (ref 6.0–8.5)

## 2019-01-20 LAB — PSA: Prostate Specific Ag, Serum: 0.7 ng/mL (ref 0.0–4.0)

## 2019-01-20 LAB — TSH: TSH: 3.9 u[IU]/mL (ref 0.450–4.500)

## 2019-01-23 ENCOUNTER — Telehealth: Payer: Self-pay | Admitting: Family Medicine

## 2019-01-23 DIAGNOSIS — N289 Disorder of kidney and ureter, unspecified: Secondary | ICD-10-CM

## 2019-01-23 NOTE — Telephone Encounter (Signed)
Called the facility and spoke to [facility] (care giver). Message relayed. Scheduled 2 weeks fu lab

## 2019-01-23 NOTE — Telephone Encounter (Signed)
Please let him/the facility know that his labs generally look really good, but his kidney function has gotten a bit worse. I'd like him to really push the fluids and we'll recheck it in about 2 weeks to see how it's doing. Orders in. Thanks!

## 2019-02-06 ENCOUNTER — Other Ambulatory Visit: Payer: Self-pay

## 2019-02-06 ENCOUNTER — Other Ambulatory Visit: Payer: Medicare Other

## 2019-02-06 DIAGNOSIS — N289 Disorder of kidney and ureter, unspecified: Secondary | ICD-10-CM

## 2019-02-07 LAB — BASIC METABOLIC PANEL
BUN/Creatinine Ratio: 13 (ref 10–24)
BUN: 20 mg/dL (ref 8–27)
CO2: 20 mmol/L (ref 20–29)
Calcium: 9.8 mg/dL (ref 8.6–10.2)
Chloride: 106 mmol/L (ref 96–106)
Creatinine, Ser: 1.53 mg/dL — ABNORMAL HIGH (ref 0.76–1.27)
GFR calc Af Amer: 50 mL/min/{1.73_m2} — ABNORMAL LOW (ref >59)
GFR calc non Af Amer: 44 mL/min/{1.73_m2} — ABNORMAL LOW (ref >59)
Glucose: 89 mg/dL (ref 65–99)
Potassium: 4.4 mmol/L (ref 3.5–5.2)
Sodium: 145 mmol/L — ABNORMAL HIGH (ref 134–144)

## 2019-02-08 ENCOUNTER — Telehealth: Payer: Self-pay | Admitting: Family Medicine

## 2019-02-08 NOTE — Telephone Encounter (Signed)
Please let his facility know that his kidney function is back to where it usually is. Thanks!

## 2019-02-08 NOTE — Telephone Encounter (Signed)
Called and left a voicemail.

## 2019-02-20 ENCOUNTER — Other Ambulatory Visit: Payer: Self-pay

## 2019-02-20 ENCOUNTER — Ambulatory Visit (INDEPENDENT_AMBULATORY_CARE_PROVIDER_SITE_OTHER): Payer: Medicare Other | Admitting: Family Medicine

## 2019-02-20 ENCOUNTER — Encounter: Payer: Self-pay | Admitting: Family Medicine

## 2019-02-20 DIAGNOSIS — I129 Hypertensive chronic kidney disease with stage 1 through stage 4 chronic kidney disease, or unspecified chronic kidney disease: Secondary | ICD-10-CM

## 2019-02-20 NOTE — Progress Notes (Signed)
BP 125/74   Pulse 73   Temp 98.8 F (37.1 C)   SpO2 96%    Subjective:    Patient ID: Chad Banks, male    DOB: 01-14-1941, 78 y.o.   MRN: [id number]  HPI: Chad Banks is a 78 y.o. male  Chief Complaint  Patient presents with  . Hypertension   HYPERTENSION- over treated last time and was getting dizzy, better now Hypertension status: better  Satisfied with current treatment? yes Duration of hypertension: chronic BP monitoring frequency:  a few times a month BP range:  BP medication side effects:  no Medication compliance: excellent compliance Previous BP meds:amlodipine, HCTZ(d/c'd last visit) Aspirin: no Recurrent headaches: no Visual changes: no Palpitations: no Dyspnea: no Chest pain: no Lower extremity edema: no Dizzy/lightheaded: yes  Relevant past medical, surgical, family and social history reviewed and updated as indicated. Interim medical history since our last visit reviewed. Allergies and medications reviewed and updated.  Review of Systems  Constitutional: Positive for chills. Negative for activity change, appetite change, diaphoresis, fatigue, fever and unexpected weight change.  Respiratory: Negative.   Cardiovascular: Negative.   Gastrointestinal: Negative.   Neurological: Positive for dizziness. Negative for tremors, seizures, syncope, facial asymmetry, speech difficulty, weakness, light-headedness, numbness and headaches.  Psychiatric/Behavioral: Negative.     Per HPI unless specifically indicated above     Objective:    BP 125/74   Pulse 73   Temp 98.8 F (37.1 C)   SpO2 96%   Wt Readings from Last 3 Encounters:  01/19/19 128 lb (58.1 kg)  04/11/18 136 lb (61.7 kg)  10/07/17 134 lb (60.8 kg)    Orthostatic VS for the past 24 hrs:  BP- Lying Pulse- Lying BP- Sitting Pulse- Sitting BP- Standing at 0 minutes Pulse- Standing at 0 minutes  02/20/19 0939 99/63 67 93/62 69 95/63 71    Physical Exam Vitals signs and nursing note  reviewed.  Constitutional:      General: He is not in acute distress.    Appearance: Normal appearance. He is not ill-appearing, toxic-appearing or diaphoretic.  HENT:     Head: Normocephalic and atraumatic.     Right Ear: External ear normal.     Left Ear: External ear normal.     Nose: Nose normal.     Mouth/Throat:     Mouth: Mucous membranes are moist.     Pharynx: Oropharynx is clear.  Eyes:     General: No scleral icterus.       Right eye: No discharge.        Left eye: No discharge.     Extraocular Movements: Extraocular movements intact.     Pupils: Pupils are equal, round, and reactive to light.     Comments: Blind R eye  Neck:     Musculoskeletal: Normal range of motion and neck supple.  Cardiovascular:     Rate and Rhythm: Normal rate and regular rhythm.     Pulses: Normal pulses.     Heart sounds: Normal heart sounds. No murmur. No friction rub. No gallop.   Pulmonary:     Effort: Pulmonary effort is normal. No respiratory distress.     Breath sounds: Normal breath sounds. No stridor. No wheezing, rhonchi or rales.  Chest:     Chest wall: No tenderness.  Musculoskeletal: Normal range of motion.  Skin:    General: Skin is warm and dry.     Capillary Refill: Capillary refill takes less than 2 seconds.  Coloration: Skin is not jaundiced or pale.     Findings: No bruising, erythema, lesion or rash.  Neurological:     General: No focal deficit present.     Mental Status: He is alert and oriented to person, place, and time. Mental status is at baseline.  Psychiatric:        Mood and Affect: Mood normal.        Behavior: Behavior normal.        Thought Content: Thought content normal.        Judgment: Judgment normal.     Results for orders placed or performed in visit on 44/81/85  Basic metabolic panel  Result Value Ref Range   Glucose 89 65 - 99 mg/dL   BUN 20 8 - 27 mg/dL   Creatinine, Ser 1.53 (H) 0.76 - 1.27 mg/dL   GFR calc non Af Amer 44 (L) >59  mL/min/1.73   GFR calc Af Amer 50 (L) >59 mL/min/1.73   BUN/Creatinine Ratio 13 10 - 24   Sodium 145 (H) 134 - 144 mmol/L   Potassium 4.4 3.5 - 5.2 mmol/L   Chloride 106 96 - 106 mmol/L   CO2 20 20 - 29 mmol/L   Calcium 9.8 8.6 - 10.2 mg/dL      Assessment & Plan:   Problem List Items Addressed This Visit      Genitourinary   Benign hypertensive renal disease (Chronic)    BP still running low. Will cut amlodipine in 1/2 and recheck 1 month. Call with any concerns.           Follow up plan: Return in about 4 weeks (around 03/20/2019) for follow up BP.

## 2019-02-20 NOTE — Assessment & Plan Note (Addendum)
BP still running low. Will cut amlodipine in 1/2 and recheck 1 month. Call with any concerns.

## 2019-03-23 ENCOUNTER — Ambulatory Visit: Payer: Medicare Other | Admitting: Family Medicine

## 2019-04-13 ENCOUNTER — Ambulatory Visit: Payer: Medicare Other | Admitting: Family Medicine

## 2019-05-29 ENCOUNTER — Other Ambulatory Visit: Payer: Self-pay

## 2019-05-29 ENCOUNTER — Encounter: Payer: Self-pay | Admitting: Family Medicine

## 2019-05-29 ENCOUNTER — Ambulatory Visit (INDEPENDENT_AMBULATORY_CARE_PROVIDER_SITE_OTHER): Payer: Medicare Other | Admitting: Family Medicine

## 2019-05-29 VITALS — BP 162/82 | HR 84 | Temp 97.8°F

## 2019-05-29 DIAGNOSIS — I129 Hypertensive chronic kidney disease with stage 1 through stage 4 chronic kidney disease, or unspecified chronic kidney disease: Secondary | ICD-10-CM

## 2019-05-29 DIAGNOSIS — H6123 Impacted cerumen, bilateral: Secondary | ICD-10-CM

## 2019-05-29 DIAGNOSIS — R21 Rash and other nonspecific skin eruption: Secondary | ICD-10-CM

## 2019-05-29 DIAGNOSIS — M7989 Other specified soft tissue disorders: Secondary | ICD-10-CM | POA: Diagnosis not present

## 2019-05-29 MED ORDER — DEBROX 6.5 % OT SOLN: 5.0000 [drp] | Freq: Two times a day (BID) | OTIC | 0 refills | Status: DC

## 2019-05-29 MED ORDER — FLUCONAZOLE 150 MG PO TABS: 150.0000 mg | ORAL_TABLET | Freq: Every day | ORAL | 0 refills | Status: DC

## 2019-05-29 NOTE — Progress Notes (Signed)
BP (!) 162/82   Pulse 84   Temp 97.8 F (36.6 C) (Oral)   SpO2 99%    Subjective:    Patient ID: Chad Banks, male    DOB: Jan 26, 1941, 79 y.o.   MRN: [id number]  HPI: Chad Banks is a 78 y.o. male  Chief Complaint  Patient presents with  . Hypertension    high on recheck  . Rash    on neck, states ketoconazole is not helping  . Foot Swelling    L foot, nurse states she noticied it this morning while the patient was putting his shoes on   Patient here today for HTN f/u after decreasing amlodipine to 5 mg due to continued low BPs and intermittent dizziness. Caregiver who presents with him today states they have not been checking home blood pressures but can start doing so. He's still getting dizzy every now and then, mostly with position change. Tries to drink plenty of water. Denies syncope, CP, SOB. Caregiver notes some lower leg swelling this morning, worst on left leg. No pain per patient.   Rash on neck, has been there several months. Rash is very itchy. Not improved with ketoconazole cream. No new soaps, products, foods prior to onset.   Pressure, muffled hearing b/l ears lately. Denies recent illness, headache, sharp shooting pains in ears. Not trying anything OTC for relief.   Relevant past medical, surgical, family and social history reviewed and updated as indicated. Interim medical history since our last visit reviewed. Allergies and medications reviewed and updated.  Review of Systems  Per HPI unless specifically indicated above     Objective:    BP (!) 162/82   Pulse 84   Temp 97.8 F (36.6 C) (Oral)   SpO2 99%   Wt Readings from Last 3 Encounters:  01/19/19 128 lb (58.1 kg)  04/11/18 136 lb (61.7 kg)  10/07/17 134 lb (60.8 kg)    Physical Exam Vitals signs and nursing note reviewed.  Constitutional:      Appearance: Normal appearance.  HENT:     Head: Atraumatic.     Ears:     Comments: B/l cerumen impaction Eyes:     Extraocular  Movements: Extraocular movements intact.     Conjunctiva/sclera: Conjunctivae normal.  Neck:     Musculoskeletal: Normal range of motion and neck supple.  Cardiovascular:     Rate and Rhythm: Normal rate and regular rhythm.  Pulmonary:     Effort: Pulmonary effort is normal.     Breath sounds: Normal breath sounds.  Abdominal:     General: Bowel sounds are normal. There is no distension.     Palpations: Abdomen is soft.     Tenderness: There is no abdominal tenderness.  Musculoskeletal: Normal range of motion.        General: Swelling (1+ edema left lower leg, trace edema right lower leg) present.  Skin:    General: Skin is warm and dry.     Findings: Rash (diffuse hyperpigmented macular rash down posterior neck extending to upper back, some areas of dryness) present.     Comments: No abrasions or erythema noted to left lower leg  Neurological:     General: No focal deficit present.     Mental Status: Mental status is at baseline.  Psychiatric:        Mood and Affect: Mood normal.        Thought Content: Thought content normal.        Judgment:  Judgment normal.    Procedure: B/l ear lavage for cerumen impaction Procedure explained in detail to patient, who was given opportunity to ask questions and consent to procedure.  Warm water and lavage tool used to b/l ears with successful clearing of wax buildup in right ear. Patient ended procedure on left ear prior to fully clearing EAC due to discomfort. No immediate complications noted. Debrox drops sent to pharmacy with instructions for softening wax buildup.    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 Will continue with decreased amlodipine for now until we can establish what baseline home readings are doing given his previous orthostatic sxs. Caregiver will make sure it's checked and bring a log to next appt. Continue current regimen       Other Visit Diagnoses    Rash       Appears fungal, but not improved with ketoconazole cream. Trial oral diflucan, continued use of cream. Referral to Dermatology placed for further mgmt   Relevant Orders   Ambulatory referral to Dermatology   Bilateral impacted cerumen       Lavage performed with moderate success. Use debrox drops nightly prn until wax plug comes out   Leg swelling       Increase physical activity, compression stockings, leg elevation, fluids. F/u if not improving       Follow up plan: Return in about 3 months (around 08/29/2019) for BP and chronic condition f/u with Dr. Wynetta Emery.

## 2019-06-02 NOTE — Assessment & Plan Note (Signed)
BP elevated today, but do not have any home readings to determine what his current baseline is. Will continue with decreased amlodipine for now until we can establish what baseline home readings are doing given his previous orthostatic sxs. Caregiver will make sure it's checked and bring a log to next appt. Continue current regimen

## 2019-06-09 ENCOUNTER — Telehealth: Payer: Self-pay | Admitting: Family Medicine

## 2019-06-09 DIAGNOSIS — R300 Dysuria: Secondary | ICD-10-CM

## 2019-06-09 NOTE — Telephone Encounter (Signed)
Queen calling from [facility] would like to know if they can have and order for urinalysis. She states that pt has altered mental status. Not bad enough to go to ER. Foul smell coming from urine . Please advise

## 2019-06-12 NOTE — Telephone Encounter (Signed)
Pt has  Telephone visit with rachel lane tomorrow 06-13-2019

## 2019-06-12 NOTE — Addendum Note (Signed)
Addended by: Valerie Roys on: 06/12/2019 10:47 AM   Modules accepted: Orders

## 2019-06-12 NOTE — Telephone Encounter (Signed)
[facility], no answer, left vm

## 2019-06-12 NOTE — Telephone Encounter (Signed)
OK to drop off urine and have phone visit.

## 2019-06-13 ENCOUNTER — Encounter: Payer: Self-pay | Admitting: Family Medicine

## 2019-06-13 ENCOUNTER — Other Ambulatory Visit: Payer: Self-pay

## 2019-06-13 ENCOUNTER — Ambulatory Visit (INDEPENDENT_AMBULATORY_CARE_PROVIDER_SITE_OTHER): Payer: Medicare Other | Admitting: Family Medicine

## 2019-06-13 VITALS — Temp 97.7°F

## 2019-06-13 DIAGNOSIS — R451 Restlessness and agitation: Secondary | ICD-10-CM

## 2019-06-13 NOTE — Progress Notes (Signed)
Temp 97.7 F (36.5 C) (Temporal)    Subjective:    Patient ID: KIERE EASTERWOOD, male    DOB: 04-14-1941, 78 y.o.   MRN: [id number]  HPI: ABDO HABEEB is a 78 y.o. male  Chief Complaint  Patient presents with  . Urinary Tract Infection    pt care giver states that he has been agitated and mental status changes since last Friday    . This visit was completed via WebEx due to the restrictions of the COVID-19 pandemic. All issues as above were discussed and addressed. Physical exam was done as above through visual confirmation on WebEx. If it was felt that the patient should be evaluated in the office, they were directed there. The patient verbally consented to this visit. . Location of the patient: home . Location of the provider: home . Those involved with this call:  . Provider: Merrie Roof, PA-C . CMA: Lesle Chris, [place] . Front Desk/Registration: Jill Side  . Time spent on call: 15 minutes with patient face to face via video conference. More than 50% of this time was spent in counseling and coordination of care. 5 minutes total spent in review of patient's record and preparation of their chart. I verified patient identity using two factors (patient name and date of birth). Patient consents verbally to being seen via telemedicine visit today.   Patient presenting today with caregiver, who provides all the history today for this visit. Has not been himself the past 4 or so days, seems a bit lost when trying to find rooms, increased agitation, strong urine odor. Denies fevers, abdominal pain, sweats, recent illnesses, new stressors or environmental changes, medication changes. States he's eating and drinking normally without issue. So far they have been unable to obtain a urine sample to r/o UTI. She states he was improving some yesterday evening and actually seemed back to his usual self today after increasing water intake.   Relevant past medical, surgical, family and social  history reviewed and updated as indicated. Interim medical history since our last visit reviewed. Allergies and medications reviewed and updated.  Review of Systems  Per HPI unless specifically indicated above     Objective:    Temp 97.7 F (36.5 C) (Temporal)   Wt Readings from Last 3 Encounters:  01/19/19 128 lb (58.1 kg)  04/11/18 136 lb (61.7 kg)  10/07/17 134 lb (60.8 kg)    Physical Exam Vitals signs and nursing note reviewed.  Constitutional:      General: He is not in acute distress.    Appearance: Normal appearance.  HENT:     Head: Atraumatic.     Right Ear: External ear normal.     Left Ear: External ear normal.     Nose: Nose normal. No congestion.     Mouth/Throat:     Mouth: Mucous membranes are moist.     Pharynx: Oropharynx is clear.  Eyes:     Extraocular Movements: Extraocular movements intact.     Conjunctiva/sclera: Conjunctivae normal.  Neck:     Musculoskeletal: Normal range of motion.  Pulmonary:     Effort: Pulmonary effort is normal. No respiratory distress.  Musculoskeletal: Normal range of motion.  Skin:    General: Skin is dry.     Findings: No erythema or rash.  Neurological:     Mental Status: He is oriented to person, place, and time. Mental status is at baseline.  Psychiatric:        Mood and Affect:  Mood normal.     Results for orders placed or performed in visit on 123XX123  Basic metabolic panel  Result Value Ref Range   Glucose 89 65 - 99 mg/dL   BUN 20 8 - 27 mg/dL   Creatinine, Ser 1.53 (H) 0.76 - 1.27 mg/dL   GFR calc non Af Amer 44 (L) >59 mL/min/1.73   GFR calc Af Amer 50 (L) >59 mL/min/1.73   BUN/Creatinine Ratio 13 10 - 24   Sodium 145 (H) 134 - 144 mmol/L   Potassium 4.4 3.5 - 5.2 mmol/L   Chloride 106 96 - 106 mmol/L   CO2 20 20 - 29 mmol/L   Calcium 9.8 8.6 - 10.2 mg/dL      Assessment & Plan:   Problem List Items Addressed This Visit    None    Visit Diagnoses    Agitation    -  Primary   Resolved  per caregiver, but they will attempt a urine sample again tomorrow just to be sure. Call back if sxs return. Await lab results. Push fluids   Relevant Orders   UA/M w/rflx Culture, Routine       Follow up plan: Return if symptoms worsen or fail to improve.

## 2019-06-27 ENCOUNTER — Other Ambulatory Visit: Payer: Self-pay

## 2019-06-27 ENCOUNTER — Other Ambulatory Visit: Payer: Medicare Other

## 2019-06-27 ENCOUNTER — Telehealth: Payer: Self-pay | Admitting: Family Medicine

## 2019-06-27 DIAGNOSIS — R300 Dysuria: Secondary | ICD-10-CM

## 2019-06-27 LAB — UA/M W/RFLX CULTURE, ROUTINE
Bilirubin, UA: NEGATIVE
Glucose, UA: NEGATIVE
Ketones, UA: NEGATIVE
Leukocytes,UA: NEGATIVE
Nitrite, UA: NEGATIVE
Protein,UA: NEGATIVE
RBC, UA: NEGATIVE
Specific Gravity, UA: 1.02 (ref 1.005–1.030)
Urobilinogen, Ur: 0.2 mg/dL (ref 0.2–1.0)
pH, UA: 5.5 (ref 5.0–7.5)

## 2019-06-27 NOTE — Telephone Encounter (Signed)
Please let them know that his urine was totally clear. Thanks.

## 2019-06-27 NOTE — Telephone Encounter (Signed)
Patient's caregiver notified.

## 2019-08-14 ENCOUNTER — Inpatient Hospital Stay
Admission: EM | Admit: 2019-08-14 | Discharge: 2019-08-17 | DRG: 682 | Disposition: A | Discharge status: Hospice | Payer: Medicare Other | Source: Skilled Nursing Facility | Attending: Internal Medicine | Admitting: Internal Medicine

## 2019-08-14 ENCOUNTER — Encounter: Payer: Self-pay | Admitting: Emergency Medicine

## 2019-08-14 ENCOUNTER — Other Ambulatory Visit: Payer: Self-pay

## 2019-08-14 ENCOUNTER — Emergency Department: Payer: Medicare Other

## 2019-08-14 DIAGNOSIS — L89300 Pressure ulcer of unspecified buttock, unstageable: Secondary | ICD-10-CM | POA: Diagnosis not present

## 2019-08-14 DIAGNOSIS — I129 Hypertensive chronic kidney disease with stage 1 through stage 4 chronic kidney disease, or unspecified chronic kidney disease: Secondary | ICD-10-CM | POA: Diagnosis present

## 2019-08-14 DIAGNOSIS — Z811 Family history of alcohol abuse and dependence: Secondary | ICD-10-CM | POA: Diagnosis not present

## 2019-08-14 DIAGNOSIS — R627 Adult failure to thrive: Secondary | ICD-10-CM | POA: Diagnosis present

## 2019-08-14 DIAGNOSIS — R64 Cachexia: Secondary | ICD-10-CM | POA: Diagnosis present

## 2019-08-14 DIAGNOSIS — Z66 Do not resuscitate: Secondary | ICD-10-CM | POA: Diagnosis present

## 2019-08-14 DIAGNOSIS — R636 Underweight: Secondary | ICD-10-CM | POA: Diagnosis present

## 2019-08-14 DIAGNOSIS — Z825 Family history of asthma and other chronic lower respiratory diseases: Secondary | ICD-10-CM

## 2019-08-14 DIAGNOSIS — L89152 Pressure ulcer of sacral region, stage 2: Secondary | ICD-10-CM | POA: Diagnosis present

## 2019-08-14 DIAGNOSIS — Z79899 Other long term (current) drug therapy: Secondary | ICD-10-CM | POA: Diagnosis not present

## 2019-08-14 DIAGNOSIS — E860 Dehydration: Secondary | ICD-10-CM | POA: Diagnosis present

## 2019-08-14 DIAGNOSIS — H179 Unspecified corneal scar and opacity: Secondary | ICD-10-CM | POA: Diagnosis present

## 2019-08-14 DIAGNOSIS — Z681 Body mass index (BMI) 19 or less, adult: Secondary | ICD-10-CM | POA: Diagnosis not present

## 2019-08-14 DIAGNOSIS — E870 Hyperosmolality and hypernatremia: Secondary | ICD-10-CM | POA: Diagnosis present

## 2019-08-14 DIAGNOSIS — R319 Hematuria, unspecified: Secondary | ICD-10-CM

## 2019-08-14 DIAGNOSIS — F1027 Alcohol dependence with alcohol-induced persisting dementia: Secondary | ICD-10-CM | POA: Diagnosis present

## 2019-08-14 DIAGNOSIS — Z888 Allergy status to other drugs, medicaments and biological substances status: Secondary | ICD-10-CM

## 2019-08-14 DIAGNOSIS — Z87891 Personal history of nicotine dependence: Secondary | ICD-10-CM | POA: Diagnosis not present

## 2019-08-14 DIAGNOSIS — Z8261 Family history of arthritis: Secondary | ICD-10-CM | POA: Diagnosis not present

## 2019-08-14 DIAGNOSIS — F0391 Unspecified dementia with behavioral disturbance: Secondary | ICD-10-CM | POA: Diagnosis not present

## 2019-08-14 DIAGNOSIS — N1831 Chronic kidney disease, stage 3a: Secondary | ICD-10-CM | POA: Diagnosis present

## 2019-08-14 DIAGNOSIS — N179 Acute kidney failure, unspecified: Secondary | ICD-10-CM | POA: Diagnosis present

## 2019-08-14 DIAGNOSIS — Z20822 Contact with and (suspected) exposure to covid-19: Secondary | ICD-10-CM | POA: Diagnosis present

## 2019-08-14 DIAGNOSIS — Z515 Encounter for palliative care: Secondary | ICD-10-CM

## 2019-08-14 DIAGNOSIS — Z8249 Family history of ischemic heart disease and other diseases of the circulatory system: Secondary | ICD-10-CM

## 2019-08-14 DIAGNOSIS — G9341 Metabolic encephalopathy: Secondary | ICD-10-CM | POA: Diagnosis present

## 2019-08-14 DIAGNOSIS — N2889 Other specified disorders of kidney and ureter: Secondary | ICD-10-CM

## 2019-08-14 DIAGNOSIS — C641 Malignant neoplasm of right kidney, except renal pelvis: Secondary | ICD-10-CM | POA: Diagnosis present

## 2019-08-14 DIAGNOSIS — E871 Hypo-osmolality and hyponatremia: Secondary | ICD-10-CM | POA: Diagnosis present

## 2019-08-14 DIAGNOSIS — R5381 Other malaise: Secondary | ICD-10-CM | POA: Diagnosis present

## 2019-08-14 DIAGNOSIS — F03918 Unspecified dementia, unspecified severity, with other behavioral disturbance: Secondary | ICD-10-CM

## 2019-08-14 DIAGNOSIS — R310 Gross hematuria: Secondary | ICD-10-CM | POA: Diagnosis not present

## 2019-08-14 DIAGNOSIS — G9340 Encephalopathy, unspecified: Secondary | ICD-10-CM | POA: Diagnosis not present

## 2019-08-14 DIAGNOSIS — L8990 Pressure ulcer of unspecified site, unspecified stage: Secondary | ICD-10-CM | POA: Insufficient documentation

## 2019-08-14 DIAGNOSIS — J449 Chronic obstructive pulmonary disease, unspecified: Secondary | ICD-10-CM | POA: Diagnosis present

## 2019-08-14 LAB — BASIC METABOLIC PANEL
Anion gap: 12 (ref 5–15)
BUN: 64 mg/dL — ABNORMAL HIGH (ref 8–23)
CO2: 24 mmol/L (ref 22–32)
Calcium: 10.2 mg/dL (ref 8.9–10.3)
Chloride: 117 mmol/L — ABNORMAL HIGH (ref 98–111)
Creatinine, Ser: 2.97 mg/dL — ABNORMAL HIGH (ref 0.61–1.24)
GFR calc Af Amer: 23 mL/min — ABNORMAL LOW (ref >60)
GFR calc non Af Amer: 20 mL/min — ABNORMAL LOW (ref >60)
Glucose, Bld: 117 mg/dL — ABNORMAL HIGH (ref 70–99)
Potassium: 4.4 mmol/L (ref 3.5–5.1)
Sodium: 153 mmol/L — ABNORMAL HIGH (ref 135–145)

## 2019-08-14 LAB — CBC
HCT: 37.8 % — ABNORMAL LOW (ref 39.0–52.0)
Hemoglobin: 11.9 g/dL — ABNORMAL LOW (ref 13.0–17.0)
MCH: 28 pg (ref 26.0–34.0)
MCHC: 31.5 g/dL (ref 30.0–36.0)
MCV: 88.9 fL (ref 80.0–100.0)
Platelets: 352 K/uL (ref 150–400)
RBC: 4.25 MIL/uL (ref 4.22–5.81)
RDW: 14.8 % (ref 11.5–15.5)
WBC: 8.9 K/uL (ref 4.0–10.5)
nRBC: 0 % (ref 0.0–0.2)

## 2019-08-14 LAB — POC SARS CORONAVIRUS 2 AG: SARS Coronavirus 2 Ag: NEGATIVE

## 2019-08-14 LAB — LACTIC ACID, PLASMA: Lactic Acid, Venous: 1.4 mmol/L (ref 0.5–1.9)

## 2019-08-14 MED ORDER — ACETAMINOPHEN 650 MG RE SUPP: 650.0000 mg | Freq: Four times a day (QID) | RECTAL | Status: DC | PRN

## 2019-08-14 MED ORDER — LACTATED RINGERS IV SOLN: INTRAVENOUS | Status: DC

## 2019-08-14 MED ORDER — SENNA 8.6 MG PO TABS
1.0000 | ORAL_TABLET | Freq: Two times a day (BID) | ORAL | Status: DC
  Administered 2019-08-16: 8.6 mg via ORAL
  Filled 2019-08-14 (×2): qty 1

## 2019-08-14 MED ORDER — POLYETHYLENE GLYCOL 3350 17 G PO PACK: 17.0000 g | PACK | Freq: Every day | ORAL | Status: DC | PRN

## 2019-08-14 MED ORDER — HALOPERIDOL LACTATE 5 MG/ML IJ SOLN
2.0000 mg | Freq: Once | INTRAMUSCULAR | Status: AC
  Administered 2019-08-14: 2 mg via INTRAVENOUS
  Filled 2019-08-14: qty 1

## 2019-08-14 MED ORDER — ACETAMINOPHEN 325 MG PO TABS: 650.0000 mg | ORAL_TABLET | Freq: Four times a day (QID) | ORAL | Status: DC | PRN

## 2019-08-14 MED ORDER — ONDANSETRON HCL 4 MG PO TABS: 4.0000 mg | ORAL_TABLET | Freq: Four times a day (QID) | ORAL | Status: DC | PRN

## 2019-08-14 MED ORDER — DROPERIDOL 2.5 MG/ML IJ SOLN
2.5000 mg | Freq: Once | INTRAMUSCULAR | Status: AC
  Administered 2019-08-14: 2.5 mg via INTRAMUSCULAR
  Filled 2019-08-14: qty 2

## 2019-08-14 MED ORDER — ONDANSETRON HCL 4 MG/2ML IJ SOLN: 4.0000 mg | Freq: Four times a day (QID) | INTRAMUSCULAR | Status: DC | PRN

## 2019-08-14 MED ORDER — SODIUM CHLORIDE 0.9 % IV SOLN
500.0000 mg | Freq: Once | INTRAVENOUS | Status: AC
  Administered 2019-08-14: 500 mg via INTRAVENOUS
  Filled 2019-08-14: qty 500

## 2019-08-14 MED ORDER — SODIUM CHLORIDE 0.9 % IV BOLUS
1000.0000 mL | Freq: Once | INTRAVENOUS | Status: AC
  Administered 2019-08-14: 1000 mL via INTRAVENOUS

## 2019-08-14 MED ORDER — HEPARIN SODIUM (PORCINE) 5000 UNIT/ML IJ SOLN
5000.0000 [IU] | Freq: Three times a day (TID) | INTRAMUSCULAR | Status: DC
  Administered 2019-08-15 – 2019-08-16 (×3): 5000 [IU] via SUBCUTANEOUS
  Filled 2019-08-14 (×4): qty 1

## 2019-08-14 MED ORDER — SODIUM CHLORIDE 0.9 % IV SOLN
2.0000 g | Freq: Once | INTRAVENOUS | Status: AC
  Administered 2019-08-14: 2 g via INTRAVENOUS
  Filled 2019-08-14: qty 20

## 2019-08-14 MED ORDER — DEXTROSE-NACL 5-0.2 % IV SOLN: INTRAVENOUS | Status: DC

## 2019-08-14 NOTE — ED Provider Notes (Signed)
[facility] Emergency Department Provider Note  ____________________________________________   First MD Initiated Contact with Patient 08/14/19 1130     (approximate)  I have reviewed the triage vital signs and the nursing notes.   HISTORY  Chief Complaint Weakness    HPI Chad Banks is a 79 y.o. male  With h/o alcoholic dementia, CKD, COPD, here with AMS. Pt reportedly was at [facility] living. He has been increasingly agitated per PCP notes and has been refusing to [place]. He is normally able to ambulate as well but has been essentially unable to get out of his chair.  On my assessment, the patient is agitated, refusing to speak to me.  He is attempting to hit staff.  Level 5 caveat invoked as remainder of history, ROS, and physical exam limited due to patient's AMS.        Past Medical History:  Diagnosis Date  . 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 Chronic kidney disease   . COPD (chronic obstructive pulmonary disease) ([place])   . Eye injuries    multiple, Under care of Opthamology, Right Eye  . Hypertension   . Metabolic encephalopathy   . Substance abuse ([place])    alcohol, polysubstance    Patient Active Problem List   Diagnosis Date Noted  . Acute renal failure ([place]) 08/14/2019  . Hyperlipidemia 04/21/2017  . Benign hypertensive renal disease   . Chronic kidney disease   . 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 No pertinent surgical history.  Prior to Admission medications   Medication Sig Start Date End Date Taking? Authorizing Provider  amLODipine (NORVASC) 10 MG tablet Take 1 tablet (10 mg total) by mouth daily. Patient taking differently: Take 5 mg by mouth daily.  01/19/19  Yes Johnson, Megan P, DO  carbamide peroxide (DEBROX) 6.5 % OTIC solution Place 5 drops into both ears 2 (two) times daily. 05/29/19  Yes Volney American, PA-C  ketoconazole (NIZORAL) 2 % cream Apply 1 application topically daily.  01/19/19  Yes Johnson, Megan P, DO  LORazepam (ATIVAN) 0.5 MG tablet Take 0.25 mg by mouth daily as needed for anxiety.   Yes [provider]  memantine (NAMENDA XR) 28 MG CP24 24 hr capsule Take 28 mg by mouth daily.  09/20/17  Yes [provider]  sertraline (ZOLOFT) 50 MG tablet Take 50 mg by mouth daily.   Yes [provider]    Allergies Lisinopril  Family History  Problem Relation Age of Onset  . Arthritis Mother   . Hypertension Sister   . Alcohol abuse Brother   . Asthma Brother   . Birth defects Brother     Social History Social History   Tobacco Use  . Smoking status: Former Smoker    Quit date: 07/20/2014    Years since quitting: 5.0  . Smokeless tobacco: Never Used  Substance Use Topics  . Alcohol use: Not Currently  . Drug use: No    Review of Systems  Review of Systems  Unable to perform ROS: Mental status change     ____________________________________________  PHYSICAL EXAM:      VITAL SIGNS: ED Triage Vitals  Enc Vitals Group     BP 08/14/19 1124 126/90     Pulse Rate 08/14/19 1124 (!) 105     Resp 08/14/19 1124 (!) 23     Temp 08/14/19 1124 97.8 F (36.6 C)     Temp Source 08/14/19 1124 Oral  SpO2 08/14/19 1124 100 %     Weight 08/14/19 1125 115 lb (52.2 kg)     Height 08/14/19 1125 5\' 6"  (1.676 m)     Head Circumference --      Peak Flow --      Pain Score --      Pain Loc --      Pain Edu? --      Excl. in [place]? --      Physical Exam Vitals and nursing note reviewed.  Constitutional:      General: He is not in acute distress.    Appearance: He is well-developed.  HENT:     Head: Normocephalic and atraumatic.     Mouth/Throat:     Mouth: Mucous membranes are dry.  Eyes:     Conjunctiva/sclera: Conjunctivae normal.  Cardiovascular:     Rate and Rhythm: Normal rate and regular rhythm.     Heart sounds: Normal heart sounds.  Pulmonary:     Effort: Pulmonary effort is normal. No respiratory distress.      Breath sounds: No wheezing.  Abdominal:     General: There is no distension.  Musculoskeletal:     Cervical back: Neck supple.  Skin:    General: Skin is warm.     Capillary Refill: Capillary refill takes less than 2 seconds.     Findings: No rash.  Neurological:     Mental Status: He is disoriented and confused.     Motor: No abnormal muscle tone.       ____________________________________________   LABS (all labs ordered are listed, but only abnormal results are displayed)  Labs Reviewed  BASIC METABOLIC PANEL - Abnormal; Notable for the following components:      Result Value   Sodium 153 (*)    Chloride 117 (*)    Glucose, Bld 117 (*)    BUN 64 (*)    Creatinine, Ser 2.97 (*)    GFR calc non Af Amer 20 (*)    GFR calc Af Amer 23 (*)    All other components within normal limits  CBC - Abnormal; Notable for the following components:   Hemoglobin 11.9 (*)    HCT 37.8 (*)    All other components within normal limits  CULTURE, BLOOD (ROUTINE X 2)  CULTURE, BLOOD (ROUTINE X 2)  SARS CORONAVIRUS 2 (TAT 6-24 HRS)  LACTIC ACID, PLASMA  URINALYSIS, COMPLETE (UACMP) WITH MICROSCOPIC  POC SARS CORONAVIRUS 2 AG -  ED  POC SARS CORONAVIRUS 2 AG  CBG MONITORING, ED    ____________________________________________  EKG:  ________________________________________  RADIOLOGY All imaging, including plain films, CT scans, and ultrasounds, independently reviewed by me, and interpretations confirmed via formal radiology reads.  ED MD interpretation:   Chest x-ray: Patchy consolidation right lung base  Official radiology report(s): CT Head Wo Contrast  Result Date: 08/14/2019 CLINICAL DATA:  Increasing weakness and decreased oral intake over the last week. EXAM: CT HEAD WITHOUT CONTRAST TECHNIQUE: Contiguous axial images were obtained from the base of the skull through the vertex without intravenous contrast. COMPARISON:  None. FINDINGS: Despite efforts by the technologist and  patient, mild motion artifact is present on today's exam and could not be eliminated. This reduces exam sensitivity and specificity. Multiple images were repeated. Brain: There is no evidence of acute intracranial hemorrhage, mass lesion, brain edema or extra-axial fluid collection. There is mild atrophy with mild prominence of the ventricles and subarachnoid spaces. There are mild chronic small vessel ischemic  changes with asymmetric periventricular encephalomalacia in the right temporal lobe. There is no CT evidence of acute cortical infarction. Vascular: Intracranial vascular calcifications. No hyperdense vessel identified. Skull: Negative for fracture or focal lesion. Sinuses/Orbits: The visualized paranasal sinuses and mastoid air cells are clear. No orbital abnormalities are seen. Other: Previous lens surgery on the right. IMPRESSION: 1. No acute intracranial findings. 2. Mild atrophy and chronic small vessel ischemic changes with asymmetric periventricular encephalomalacia in the right temporal lobe. Electronically Signed   By: Richardean Sale M.D.   On: 08/14/2019 15:00   DG Chest Portable 1 View  Result Date: 08/14/2019 CLINICAL DATA:  Increasing weakness EXAM: PORTABLE CHEST 1 VIEW COMPARISON:  None. FINDINGS: Rotated. Minimal patchy density at the right lung base. No pleural effusion or pneumothorax. Cardiomediastinal contours are within normal limits. IMPRESSION: Minimal patchy atelectasis/consolidation at the right lung base. Electronically Signed   By: Macy Mis M.D.   On: 08/14/2019 13:35    ____________________________________________  PROCEDURES   Procedure(s) performed (including Critical Care):  Procedures  ____________________________________________  INITIAL IMPRESSION / MDM / [place] / ED COURSE  As part of my medical decision making, I reviewed the following data within the [place] notes reviewed and incorporated, Old chart  reviewed, Notes from prior ED visits, and [place] Controlled Substance Database       *Chad Banks was evaluated in Emergency Department on 08/14/2019 for the symptoms described in the history of present illness. He was evaluated in the context of the global COVID-19 pandemic, which necessitated consideration that the patient might be at risk for infection with the SARS-CoV-2 virus that causes COVID-19. Institutional protocols and algorithms that pertain to the evaluation of patients at risk for COVID-19 are in a state of rapid change based on information released by regulatory bodies including the CDC and federal and state organizations. These policies and algorithms were followed during the patient's care in the ED.  Some ED evaluations and interventions may be delayed as a result of limited staffing during the pandemic.*     Medical Decision Making: 79 year old male here with decreased p.o. intake and increasing confusion.  Lab work shows acute kidney injury, likely prerenal.  Chest x-ray shows possible right basilar pneumonia, likely aspiration given his altered mental status.  Patient given antibiotics, fluids, will admit.  Due to his agitation, droperidol given with excellent effect.  Airway protected.  Patient maintained on monitor.  Admit to medicine.  ____________________________________________  FINAL CLINICAL IMPRESSION(S) / ED DIAGNOSES  Final diagnoses:  Encephalopathy  AKI (acute kidney injury) ([place])     MEDICATIONS GIVEN DURING THIS VISIT:  Medications  cefTRIAXone (ROCEPHIN) 2 g in sodium chloride 0.9 % 100 mL IVPB (has no administration in time range)  azithromycin (ZITHROMAX) 500 mg in sodium chloride 0.9 % 250 mL IVPB (has no administration in time range)  lactated ringers infusion (has no administration in time range)  sodium chloride 0.9 % bolus 1,000 mL (1,000 mLs Intravenous New Bag/Given 08/14/19 1357)  droperidol (INAPSINE) 2.5 MG/ML injection 2.5 mg (2.5 mg  Intramuscular Given 08/14/19 1218)     ED Discharge Orders    None       Note:  This document was prepared using Dragon voice recognition software and may include unintentional dictation errors.   Duffy Bruce, MD 08/14/19 [phone]

## 2019-08-14 NOTE — ED Notes (Signed)
Pt will not keep arm straight for IV antibiotics and fluids to be administered. Pt's IV is placed in the [facility] area. When pt is told to keep his arm straight pt becomes increasingly agitated and begins to be combative. Dr. Posey Pronto, MD made aware. See orders.

## 2019-08-14 NOTE — ED Notes (Signed)
Messaged Dr. Posey Pronto, MD about not having Dextrose 5% and 0.2% NaCl fluid in stock in the hospital per the [facility] and [employer]. Waiting on reply about maintenance fluid.

## 2019-08-14 NOTE — ED Notes (Signed)
Spoke with Dr. Posey Pronto, MD. No need for second set of blood cultures at this time since there is no source of infection but will start antibiotics.

## 2019-08-14 NOTE — H&P (Signed)
[facility] [place] NAME: Chad Banks    MR#:  [id number]  DATE OF BIRTH:  [birth year]  DATE OF ADMISSION:  08/14/2019  PRIMARY CARE PHYSICIAN: Valerie Roys, DO   REQUESTING/REFERRING PHYSICIAN:  Dr Ellender Hose  Patient coming from : [facility]  CHIEF COMPLAINT:  patient is brought in from [facility] per staff for last week patient has been increasingly weak in decreasing his PO intake.  He is ambulatory but has been in the chair and bed for the last week.  Patient is alert however has dementia unable to give any history review of systems.   HISTORY OF PRESENT ILLNESS:  Chad Banks  is a 79 y.o. male with a known history of dementia, hypertension, chronic kidney disease stage III secondary to hypertension, history of alcoholism, chronic right eye injury, COPD comes to the emergency room from [facility] with above complaints  ED course: the ER patient appeared clinically dehydrated. He is afebrile, mildly tachycardic. Blood pressure is 114/82. He is currently getting IV fluids. Initially was combative requiring some IV Haldol.  Patient was found to be clinically dehydrated. His labs showed sodium 153 creatinine of 2.97. Baseline creatinine is 1.7 (July 2020)  Patient is being admitted with acute on chronic renal failure with severe dehydration and metabolic encephalopathy/altered mental status in the setting of dementia PAST MEDICAL HISTORY:   Past Medical History:  Diagnosis Date  . 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 Chronic kidney disease   . COPD (chronic obstructive pulmonary disease) ([place])   . Eye injuries    multiple, Under care of Opthamology, Right Eye  . Hypertension   . Metabolic encephalopathy   . Substance abuse ([place])    alcohol, polysubstance    PAST SURGICAL HISTOIRY:  History reviewed. No pertinent surgical history.  SOCIAL HISTORY:   Social History    Tobacco Use  . Smoking status: Former Smoker    Quit date: 07/20/2014    Years since quitting: 5.0  . Smokeless tobacco: Never Used  Substance Use Topics  . Alcohol use: Not Currently    FAMILY HISTORY:   Family History  Problem Relation Age of Onset  . Arthritis Mother   . Hypertension Sister   . Alcohol abuse Brother   . Asthma Brother   . Birth defects Brother     DRUG ALLERGIES:   Allergies  Allergen Reactions  . Lisinopril     Chest Pain    REVIEW OF SYSTEMS:  Review of Systems  Unable to perform ROS: Dementia     MEDICATIONS AT HOME:   Prior to Admission medications   Medication Sig Start Date End Date Taking? Authorizing Provider  amLODipine (NORVASC) 10 MG tablet Take 1 tablet (10 mg total) by mouth daily. Patient taking differently: Take 5 mg by mouth daily.  01/19/19  Yes Johnson, Megan P, DO  carbamide peroxide (DEBROX) 6.5 % OTIC solution Place 5 drops into both ears 2 (two) times daily. 05/29/19  Yes Volney American, PA-C  ketoconazole (NIZORAL) 2 % cream Apply 1 application topically daily. 01/19/19  Yes Johnson, Megan P, DO  LORazepam (ATIVAN) 0.5 MG tablet Take 0.25 mg by mouth daily as needed for anxiety.   Yes [provider]  memantine (NAMENDA XR) 28 MG CP24 24 hr capsule Take 28 mg by mouth daily.  09/20/17  Yes [provider]  sertraline (ZOLOFT) 50 MG tablet Take 50  mg by mouth daily.   Yes [provider]      VITAL SIGNS:  Blood pressure 114/82, pulse (!) 102, temperature 97.8 F (36.6 C), temperature source Oral, resp. rate (!) 22, height 5\' 6"  (1.676 m), weight 52.2 kg, SpO2 98 %.  PHYSICAL EXAMINATION:  GENERAL:  79 y.o.-year-old patient lying in the bed with no acute distress. Thin cachectic EYES: Pupils equal, round, reactive to light and accommodation. No scleral icterus. Right eye corneal opacity HEENT: Head atraumatic, normocephalic. Oropharynx and nasopharynx clear. Oral mucosa dry. poor  dentition NECK:  Supple, no jugular venous distention. No thyroid enlargement, no tenderness.  LUNGS: Normal breath sounds bilaterally, no wheezing, rales,rhonchi or crepitation. No use of accessory muscles of respiration.  CARDIOVASCULAR: S1, S2 normal. No murmurs, rubs, or gallops. Mild tachycardia ABDOMEN: Soft, nontender, nondistended. Bowel sounds present. No organomegaly or mass.  EXTREMITIES: No pedal edema, cyanosis, or clubbing.  NEUROLOGIC: grossly nonfocal unable to assess given dementia and encephalopathy PSYCHIATRIC: The patient is alert does not communicate much SKIN: No obvious rash, lesion, or ulcer.-- Per RN  LABORATORY PANEL:   CBC Recent Labs  Lab 08/14/19 1128  WBC 8.9  HGB 11.9*  HCT 37.8*  PLT 352   ------------------------------------------------------------------------------------------------------------------  Chemistries  Recent Labs  Lab 08/14/19 1128  NA 153*  K 4.4  CL 117*  CO2 24  GLUCOSE 117*  BUN 64*  CREATININE 2.97*  CALCIUM 10.2   ------------------------------------------------------------------------------------------------------------------  Cardiac Enzymes No results for input(s): TROPONINI in the last 168 hours. ------------------------------------------------------------------------------------------------------------------  RADIOLOGY:  CT Head Wo Contrast  Result Date: 08/14/2019 CLINICAL DATA:  Increasing weakness and decreased oral intake over the last week. EXAM: CT HEAD WITHOUT CONTRAST TECHNIQUE: Contiguous axial images were obtained from the base of the skull through the vertex without intravenous contrast. COMPARISON:  None. FINDINGS: Despite efforts by the technologist and patient, mild motion artifact is present on today's exam and could not be eliminated. This reduces exam sensitivity and specificity. Multiple images were repeated. Brain: There is no evidence of acute intracranial hemorrhage, mass lesion, brain edema or  extra-axial fluid collection. There is mild atrophy with mild prominence of the ventricles and subarachnoid spaces. There are mild chronic small vessel ischemic changes with asymmetric periventricular encephalomalacia in the right temporal lobe. There is no CT evidence of acute cortical infarction. Vascular: Intracranial vascular calcifications. No hyperdense vessel identified. Skull: Negative for fracture or focal lesion. Sinuses/Orbits: The visualized paranasal sinuses and mastoid air cells are clear. No orbital abnormalities are seen. Other: Previous lens surgery on the right. IMPRESSION: 1. No acute intracranial findings. 2. Mild atrophy and chronic small vessel ischemic changes with asymmetric periventricular encephalomalacia in the right temporal lobe. Electronically Signed   By: Richardean Sale M.D.   On: 08/14/2019 15:00   DG Chest Portable 1 View  Result Date: 08/14/2019 CLINICAL DATA:  Increasing weakness EXAM: PORTABLE CHEST 1 VIEW COMPARISON:  None. FINDINGS: Rotated. Minimal patchy density at the right lung base. No pleural effusion or pneumothorax. Cardiomediastinal contours are within normal limits. IMPRESSION: Minimal patchy atelectasis/consolidation at the right lung base. Electronically Signed   By: Macy Mis M.D.   On: 08/14/2019 13:35    EKG:    IMPRESSION AND PLAN:   Chad Banks  is a 79 y.o. male with a known history of dementia, hypertension, chronic kidney disease stage III secondary to hypertension, history of alcoholism, chronic right eye injury, COPD comes to the emergency room from [facility] with  poor PO intake and increasing weakness for one week  1. Acute encephalopathy, metabolic with acute on chronic renal failure secondary to poor PO intake -admit to MedSurg -IV fluids -- D5  1/4 NS -monitor input output, creatinine -CT head negative for stroke, shows chronic atrophy  2. Acute on chronic renal failure stage III with  hypertension -baseline creatinine 1.7 -came in with creatinine of 2.97 -continue IV fluids -nephrology consultation if needed -avoid nephrotoxic agents -ultrasound renal if creatinine is not improved with hydration  3. Severe hyponatremia secondary to dehydration from poor PO intake -supportive management as above  4. Advanced dementia ? Alcoholic (per old record) -continue Zoloft and Namenda  5. History of hypertension-- leading BP meds since it stable  6. DVT prophylaxis heparin  Consider palliative care consultation  Family Communication : Consults : palliative care Code Status : full code-- unable to reach family DVT prophylaxis : heparin  TOTAL TIME TAKING CARE OF THIS PATIENT: *50* minutes.    Fritzi Mandes M.D  [facility]     CC: Primary care physician; Valerie Roys, DO

## 2019-08-14 NOTE — ED Triage Notes (Signed)
Pt via EMS from [facility]. Per staff, for the past week, pt has been increasingly weak and decreasing his intake. Pt is usually ambulatory but has been in the chair. Pt is alert but very difficult to understand at this time.

## 2019-08-14 NOTE — ED Notes (Signed)
This RN and Arby Barrette, RN attempted to get second set of blood cultures. Pt is agitated and combative at this time. Dr. Posey Pronto, MD made aware. Several attempts have been made for by multiple RNs to obtain 2nd set of blood cultures.

## 2019-08-14 NOTE — ED Notes (Signed)
This RN attempted twice to obtain blood cultures at this time. Unsuccessful attempt. IV team consult placed.

## 2019-08-14 NOTE — ED Notes (Signed)
This RN attempted to start IV and obtain blood work. Pt became increasingly combative and agitated at this time. Unable to obtain blood work or start IV. Dr. Myrene Buddy, MD made aware.

## 2019-08-15 ENCOUNTER — Encounter: Payer: Self-pay | Admitting: Internal Medicine

## 2019-08-15 LAB — MAGNESIUM: Magnesium: 2.6 mg/dL — ABNORMAL HIGH (ref 1.7–2.4)

## 2019-08-15 LAB — BASIC METABOLIC PANEL
Anion gap: 8 (ref 5–15)
BUN: 58 mg/dL — ABNORMAL HIGH (ref 8–23)
CO2: 24 mmol/L (ref 22–32)
Calcium: 9.6 mg/dL (ref 8.9–10.3)
Chloride: 122 mmol/L — ABNORMAL HIGH (ref 98–111)
Creatinine, Ser: 2.37 mg/dL — ABNORMAL HIGH (ref 0.61–1.24)
GFR calc Af Amer: 30 mL/min — ABNORMAL LOW (ref >60)
GFR calc non Af Amer: 26 mL/min — ABNORMAL LOW (ref >60)
Glucose, Bld: 147 mg/dL — ABNORMAL HIGH (ref 70–99)
Potassium: 3.9 mmol/L (ref 3.5–5.1)
Sodium: 154 mmol/L — ABNORMAL HIGH (ref 135–145)

## 2019-08-15 LAB — PHOSPHORUS: Phosphorus: 4 mg/dL (ref 2.5–4.6)

## 2019-08-15 LAB — SARS CORONAVIRUS 2 (TAT 6-24 HRS): SARS Coronavirus 2: NEGATIVE

## 2019-08-15 MED ORDER — MEMANTINE HCL ER 14 MG PO CP24
14.0000 mg | ORAL_CAPSULE | Freq: Every day | ORAL | Status: DC
  Administered 2019-08-16: 14 mg via ORAL
  Filled 2019-08-15 (×3): qty 1

## 2019-08-15 MED ORDER — SERTRALINE HCL 50 MG PO TABS
50.0000 mg | ORAL_TABLET | Freq: Every day | ORAL | Status: DC
  Administered 2019-08-16: 50 mg via ORAL
  Filled 2019-08-15 (×2): qty 1

## 2019-08-15 MED ORDER — DEXTROSE 5 % IV SOLN: INTRAVENOUS | Status: DC

## 2019-08-15 MED ORDER — ENSURE ENLIVE PO LIQD
237.0000 mL | Freq: Two times a day (BID) | ORAL | Status: DC
  Administered 2019-08-15 – 2019-08-16 (×2): 237 mL via ORAL

## 2019-08-15 MED ORDER — AMLODIPINE BESYLATE 5 MG PO TABS
5.0000 mg | ORAL_TABLET | Freq: Every day | ORAL | Status: DC
  Administered 2019-08-16: 5 mg via ORAL
  Filled 2019-08-15 (×2): qty 1

## 2019-08-15 NOTE — Progress Notes (Signed)
Palliative consult received, chart reviewed, patient assessed - attempting to discuss case with legal guardian. Will attempt again 1/27.  Juel Burrow, DNP, AGNP-C Palliative Medicine Team Team Phone # [phone]  Pager # [phone]  NO CHARGE

## 2019-08-15 NOTE — Progress Notes (Signed)
PT Cancellation Note  Patient Details Name: Chad Banks MRN: [id number] DOB: May 14, 1941   Cancelled Treatment:    Reason Eval/Treat Not Completed: Fatigue/lethargy limiting ability to participate;Patient's level of consciousness. Chart reviewed, RN consulted. Evaluation attempted. Pt is asleep but alerts to voice, maintains eye contact and interest, but does not respond to author's attempts at verbal communication. Pt offers no speech or body language that would indicate that pt is receiving language in a meaningful way at this time. Pt not able to participate at this time. Will try again at later date/time.   2:45 PM, 08/15/19 Etta Grandchild, PT, DPT Physical Therapist - [facility]  [phone] ([place])     Birney C 08/15/2019, 2:43 PM

## 2019-08-15 NOTE — Progress Notes (Addendum)
Progress Note    Chad Banks  [id number] DOB: 1940-09-10  DOA: 08/14/2019 PCP: Valerie Roys, DO      Brief Narrative:    Medical records reviewed and are as summarized below:  Chad Banks is an 79 y.o. male with medical history significant for dementia, hypertension, underlying CKD stage IIIa, COPD, history of alcohol abuse, history of right eye injury, was brought to the hospital (from the assisted living facility/memory unit) because of poor oral intake and generalized weakness.  He was found to have acute kidney injury and hypernatremia.      Assessment/Plan:   Principal Problem:   AKI (acute kidney injury) ([place]) Active Problems:   Acute hypernatremia   Body mass index is 17.68 kg/m.   Acute kidney injury on CKD stage IIIa: Creatinine is slowly improving.  Baseline creatinine around 1.5-1.7.  Continue IV fluids.  Monitor BMP  Hypernatremia: Sodium level is trending up.  Change IV fluids to D5 infusion.  Monitor BMP.  Hypertension: Resume amlodipine  Sacral decubitus ulcer, stage II: Present on admission.  Local wound care.  Debility: PT and OT evaluation.  Dementia: Continue Namenda.  Supportive care.     Family Communication/Anticipated D/C date and plan/Code Status   DVT prophylaxis: Heparin Code Status: Full code Family Communication: Plan discussed with Ms. [name], legal guardian, over the phone. Disposition Plan: Possible discharge to assisted facility or SNF in 2 to 3 days      Subjective:   Patient is confused unable to provide any history  Objective:    Vitals:   08/14/19 2338 08/15/19 0056 08/15/19 0625 08/15/19 1146  BP: (!) 144/97 (!) 138/93 (!) 132/98 (!) 155/94  Pulse: (!) 108 (!) 120 85 93  Resp: 20 16 18 16   Temp: 98.2 F (36.8 C)  98.8 F (37.1 C) (!) 97.5 F (36.4 C)  TempSrc: Oral   Oral  SpO2: (!) 74% 91% 90% 100%  Weight: 49.7 kg     Height:        Intake/Output Summary (Last 24 hours) at  08/15/2019 1351 Last data filed at 08/15/2019 1006 Gross per 24 hour  Intake 1895.23 ml  Output --  Net 1895.23 ml   Filed Weights   08/14/19 1125 08/14/19 2338  Weight: 52.2 kg 49.7 kg    Exam:  GEN: NAD SKIN: Warm and dry.  Sacral decubitus ulcer, stage 2 EYES: No pallor or icterus ENT: MMM CV: RRR PULM: CTA B ABD: soft, ND, NT, +BS CNS: Alert but confused, non focal EXT: No edema or tenderness   Data Reviewed:   I have personally reviewed following labs and imaging studies:  Labs: Labs show the following:   Basic Metabolic Panel: Recent Labs  Lab 08/14/19 1128 08/15/19 0432  NA 153* 154*  K 4.4 3.9  CL 117* 122*  CO2 24 24  GLUCOSE 117* 147*  BUN 64* 58*  CREATININE 2.97* 2.37*  CALCIUM 10.2 9.6  MG  --  2.6*  PHOS  --  4.0   GFR Estimated Creatinine Clearance: 18.6 mL/min (A) (by C-G formula based on SCr of 2.37 mg/dL (H)). Liver Function Tests: No results for input(s): AST, ALT, ALKPHOS, BILITOT, PROT, ALBUMIN in the last 168 hours. No results for input(s): LIPASE, AMYLASE in the last 168 hours. No results for input(s): AMMONIA in the last 168 hours. Coagulation profile No results for input(s): INR, PROTIME in the last 168 hours.  CBC: Recent Labs  Lab 08/14/19 1128  WBC 8.9  HGB 11.9*  HCT 37.8*  MCV 88.9  PLT 352   Cardiac Enzymes: No results for input(s): CKTOTAL, CKMB, CKMBINDEX, TROPONINI in the last 168 hours. BNP (last 3 results) No results for input(s): PROBNP in the last 8760 hours. CBG: No results for input(s): GLUCAP in the last 168 hours. D-Dimer: No results for input(s): DDIMER in the last 72 hours. Hgb A1c: No results for input(s): HGBA1C in the last 72 hours. Lipid Profile: No results for input(s): CHOL, HDL, LDLCALC, TRIG, CHOLHDL, LDLDIRECT in the last 72 hours. Thyroid function studies: No results for input(s): TSH, T4TOTAL, T3FREE, THYROIDAB in the last 72 hours.  Invalid input(s): FREET3 Anemia work up: No  results for input(s): VITAMINB12, FOLATE, FERRITIN, TIBC, IRON, RETICCTPCT in the last 72 hours. Sepsis Labs: Recent Labs  Lab 08/14/19 1128 08/14/19 1146  WBC 8.9  --   LATICACIDVEN  --  1.4    Microbiology Recent Results (from the past 240 hour(s))  SARS CORONAVIRUS 2 (TAT 6-24 HRS) Nasopharyngeal Nasopharyngeal Swab     Status: None   Collection Time: 08/14/19  2:24 PM   Specimen: Nasopharyngeal Swab  Result Value Ref Range Status   SARS Coronavirus 2 NEGATIVE NEGATIVE Final    Comment: (NOTE) SARS-CoV-2 target nucleic acids are NOT DETECTED. The SARS-CoV-2 RNA is generally detectable in upper and lower respiratory specimens during the acute phase of infection. Negative results do not preclude SARS-CoV-2 infection, do not rule out co-infections with other pathogens, and should not be used as the sole basis for treatment or other patient management decisions. Negative results must be combined with clinical observations, patient history, and epidemiological information. The expected result is Negative. Fact Sheet for Patients: SugarRoll.be Fact Sheet for Healthcare Providers: https://www.woods-mathews.com/ This test is not yet approved or cleared by the [place] FDA and  has been authorized for detection and/or diagnosis of SARS-CoV-2 by FDA under an Emergency Use Authorization (EUA). This EUA will remain  in effect (meaning this test can be used) for the duration of the COVID-19 declaration under Section 56 4(b)(1) of the Act, 21 U.S.C. section 360bbb-3(b)(1), unless the authorization is terminated or revoked sooner. Performed at [facility], [place] [address], [place], [place] [address]   Blood culture (routine x 2)     Status: None (Preliminary result)   Collection Time: 08/14/19  4:45 PM   Specimen: BLOOD  Result Value Ref Range Status   Specimen Description BLOOD RIGHT ANTECUBITAL  Final   Special Requests   Final     BOTTLES DRAWN AEROBIC AND ANAEROBIC Blood Culture adequate volume   Culture   Final    NO GROWTH < 24 HOURS Performed at [facility], [address] [place],  [address]    Report Status PENDING  Incomplete    Procedures and diagnostic studies:  CT Head Wo Contrast  Result Date: 08/14/2019 CLINICAL DATA:  Increasing weakness and decreased oral intake over the last week. EXAM: CT HEAD WITHOUT CONTRAST TECHNIQUE: Contiguous axial images were obtained from the base of the skull through the vertex without intravenous contrast. COMPARISON:  None. FINDINGS: Despite efforts by the technologist and patient, mild motion artifact is present on today's exam and could not be eliminated. This reduces exam sensitivity and specificity. Multiple images were repeated. Brain: There is no evidence of acute intracranial hemorrhage, mass lesion, brain edema or extra-axial fluid collection. There is mild atrophy with mild prominence of the ventricles and subarachnoid spaces. There are mild  chronic small vessel ischemic changes with asymmetric periventricular encephalomalacia in the right temporal lobe. There is no CT evidence of acute cortical infarction. Vascular: Intracranial vascular calcifications. No hyperdense vessel identified. Skull: Negative for fracture or focal lesion. Sinuses/Orbits: The visualized paranasal sinuses and mastoid air cells are clear. No orbital abnormalities are seen. Other: Previous lens surgery on the right. IMPRESSION: 1. No acute intracranial findings. 2. Mild atrophy and chronic small vessel ischemic changes with asymmetric periventricular encephalomalacia in the right temporal lobe. Electronically Signed   By: Richardean Sale M.D.   On: 08/14/2019 15:00   DG Chest Portable 1 View  Result Date: 08/14/2019 CLINICAL DATA:  Increasing weakness EXAM: PORTABLE CHEST 1 VIEW COMPARISON:  None. FINDINGS: Rotated. Minimal patchy density at the right lung base. No pleural effusion  or pneumothorax. Cardiomediastinal contours are within normal limits. IMPRESSION: Minimal patchy atelectasis/consolidation at the right lung base. Electronically Signed   By: Macy Mis M.D.   On: 08/14/2019 13:35    Medications:   . heparin  5,000 Units Subcutaneous Q8H  . senna  1 tablet Oral BID   Continuous Infusions: . dextrose 125 mL/hr at 08/15/19 1052  . lactated ringers Stopped (08/14/19 1922)     LOS: 1 day   Jordi Lacko  [facility]   *Please refer to [email or web address], password [email or web address] to get updated schedule on who will round on this patient, as hospitalists switch teams weekly. If 7PM-7AM, please contact night-coverage at [email or web address], password [email or web address] for any overnight needs.  08/15/2019, 1:51 PM

## 2019-08-15 NOTE — Progress Notes (Signed)
Initial Nutrition Assessment  DOCUMENTATION CODES:   Underweight  INTERVENTION:   Ensure Enlive po BID, each supplement provides 350 kcal and 20 grams of protein  Magic cup TID with meals, each supplement provides 290 kcal and 9 grams of protein  Dysphagia 3 diet   Pt likely at high refeed risk; recommend monitor K, Mg and P labs daily until stable.   NUTRITION DIAGNOSIS:   Unintentional weight loss related to chronic illness(dementia) as evidenced by 14 percent weight loss in <7 months.  GOAL:   Patient will meet greater than or equal to 90% of their needs  MONITOR:   PO intake, Supplement acceptance, Labs, Weight trends, Skin, I & O's  REASON FOR ASSESSMENT:   Other (Comment)(Low BMI)    ASSESSMENT:   79 y.o. male with medical history significant for dementia, hypertension, underlying CKD stage IIIa, COPD, history of alcohol abuse, history of right eye injury, was brought to the hospital (from the assisted living facility/memory unit) because of poor oral intake and generalized weakness.  He was found to have acute kidney injury and hypernatremia.  RD working remotely.  Unable to speak with pt r/t dementia. Suspect pt with poor appetite and oral intake at baseline r/t dementia. Per chart, pt has lost 18lbs(14%) in < 7 months; this is significant. Pt initiated on a soft diet today; RD will change this to dysphagia 3 as "soft" diet means GI soft. RD will also add supplements to help pt meet his estimated needs. Palliative care consult pending. RD will monitor for GOC.   Pt at high risk for malnutrition but unable to diagnose as NFPE cannot be performed.   Medications reviewed and include: heparin, senokot, 5% dextrose @125ml /hr  Labs reviewed: Na 154(H), BUN 58(H), creat 2.37(H), P 4.0 wnl, Mg 2.6(H)  Unable to complete Nutrition-Focused physical exam at this time.   Diet Order:   Diet Order            DIET DYS 3 Room service appropriate? No; Fluid consistency:  Thin  Diet effective now             EDUCATION NEEDS:   Not appropriate for education at this time  Skin:  Skin Assessment: Reviewed RN Assessment(Stage II buttocks)  Last BM:  PTA  Height:   Ht Readings from Last 1 Encounters:  08/14/19 5\' 6"  (1.676 m)    Weight:   Wt Readings from Last 1 Encounters:  08/14/19 49.7 kg    Ideal Body Weight:  64.5 kg  BMI:  Body mass index is 17.68 kg/m.  Estimated Nutritional Needs:   Kcal:  1600-1800kcal/day  Protein:  80-90g/day  Fluid:  >1.3L/day  Koleen Distance MS, RD, LDN Pager #- [phone] Office#- [phone] After Hours Pager: [phone]

## 2019-08-15 NOTE — Progress Notes (Signed)
OT Cancellation Note  Patient Details Name: Chad Banks MRN: [id number] DOB: [birth year]   Cancelled Treatment:    Reason Eval/Treat Not Completed: Patient not medically ready. Consult received, chart reviewed. Of note, PT recently attempted to see pt, noting pt is unable to meaningfully participate. Spoke with RN who confirms pt is not responsive. RN agreeable to OT re-attempt evaluation next date as medically appropriate.   Jeni Salles, MPH, MS, OTR/L ascom [phone] 08/15/19, 3:00 PM

## 2019-08-15 NOTE — TOC Initial Note (Addendum)
Transition of Care [facility]) - Initial/Assessment Note    Patient Details  Name: DAZHON NEWITT MRN: [id number] Date of Birth: 04/05/1941  [facility] CM/SW Contact:    Beverly Sessions, RN Phone Number: 08/15/2019, 1:35 PM  Clinical Narrative:                  Reported that patient is from [place] left [place] at [phone], who is the ALF owner Message Left for [name] who is assigned DSS case worker  Per chart review patient is ambulatory at baseline,  But has had increased weakness and unable to walk      Palliative care consult pending Requested PT eval    RNCM received return call from [place] at [facility].  She states the patient can return "even if he is WC bound".  Will follow up when patient is able to participate in PT Patient Goals and CMS Choice        Expected Discharge Plan and Services                                                Prior Living Arrangements/Services                       Activities of Daily Living   ADL Screening (condition at time of admission) Patient's cognitive ability adequate to safely complete daily activities?: No  Permission Sought/Granted                  Emotional Assessment              Admission diagnosis:  Acute renal failure (HCC) [N17.9] Encephalopathy [G93.40] AKI (acute kidney injury) ([place]) [N17.9] Patient Active Problem List   Diagnosis Date Noted  . AKI (acute kidney injury) ([place]) 08/14/2019  . Encephalopathy   . Acute hypernatremia   . Hyperlipidemia 04/21/2017  . Benign hypertensive renal disease   . Chronic kidney disease   . 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

## 2019-08-15 NOTE — Progress Notes (Signed)
Discussed with Dr. Mal Misty patient not eating much and seems to be pocketing food. Ordered SLP consult.

## 2019-08-16 ENCOUNTER — Inpatient Hospital Stay: Payer: Medicare Other

## 2019-08-16 DIAGNOSIS — R627 Adult failure to thrive: Secondary | ICD-10-CM

## 2019-08-16 DIAGNOSIS — L8990 Pressure ulcer of unspecified site, unspecified stage: Secondary | ICD-10-CM | POA: Insufficient documentation

## 2019-08-16 DIAGNOSIS — L89300 Pressure ulcer of unspecified buttock, unstageable: Secondary | ICD-10-CM

## 2019-08-16 DIAGNOSIS — F03918 Unspecified dementia, unspecified severity, with other behavioral disturbance: Secondary | ICD-10-CM

## 2019-08-16 DIAGNOSIS — Z515 Encounter for palliative care: Secondary | ICD-10-CM

## 2019-08-16 DIAGNOSIS — F0391 Unspecified dementia with behavioral disturbance: Secondary | ICD-10-CM

## 2019-08-16 LAB — BASIC METABOLIC PANEL
Anion gap: 10 (ref 5–15)
BUN: 43 mg/dL — ABNORMAL HIGH (ref 8–23)
CO2: 24 mmol/L (ref 22–32)
Calcium: 9.6 mg/dL (ref 8.9–10.3)
Chloride: 117 mmol/L — ABNORMAL HIGH (ref 98–111)
Creatinine, Ser: 2.24 mg/dL — ABNORMAL HIGH (ref 0.61–1.24)
GFR calc Af Amer: 32 mL/min — ABNORMAL LOW (ref >60)
GFR calc non Af Amer: 27 mL/min — ABNORMAL LOW (ref >60)
Glucose, Bld: 121 mg/dL — ABNORMAL HIGH (ref 70–99)
Potassium: 3.9 mmol/L (ref 3.5–5.1)
Sodium: 151 mmol/L — ABNORMAL HIGH (ref 135–145)

## 2019-08-16 MED ORDER — ORAL CARE MOUTH RINSE
15.0000 mL | Freq: Two times a day (BID) | OROMUCOSAL | Status: DC
  Administered 2019-08-17: 10:00:00 15 mL via OROMUCOSAL

## 2019-08-16 MED ORDER — LORAZEPAM 2 MG/ML IJ SOLN: 1.0000 mg | Freq: Once | INTRAMUSCULAR | Status: DC

## 2019-08-16 NOTE — TOC Progression Note (Signed)
Transition of Care [facility]) - Progression Note    Patient Details  Name: Chad Banks MRN: [id number] Date of Birth: Feb 26, 1941  Transition of Care [facility]) CM/SW Contact  Beverly Sessions, RN Phone Number: 08/16/2019, 4:30 PM  Clinical Narrative:    Confirmed plan with guardian that plan is to discharge to [facility] with hospice when time.  Preference is [facility].    Referral made to Santiago Glad with [facility] to Order hospital bed for ALF  Confirmed with Elvis Coil at [facility] that patient is able to return with hospice services at discharge        Expected Discharge Plan and Services                                                 Social Determinants of Health (SDOH) Interventions    Readmission Risk Interventions No flowsheet data found.

## 2019-08-16 NOTE — Consult Note (Signed)
Urology Consult  I have been asked to see the patient by Dr. Posey Pronto, for evaluation and management of gross hematuria.  Chief Complaint: Gross hematuria  History of Present Illness: Chad Banks is a 79 y.o. year old male with advanced dementia with a legal guardian admitted on 08/14/2019 from his assisted living facility with reports of poor PO intake, failure to thrive, and found to have AKI, who subsequently developed new gross hematuria during hospitalization.  Patient is minimally responsive to verbal prompts and unable to participate in this consult.  No urology notes available per chart review.  No known history of gross hematuria.  Creatinine downtrending since admission, now 2.24.  Hemoglobin on admission 11.9.  UA ordered, not collected.  Blood cultures pending with no growth at 2 days.  Patient has an external urinary management system in place draining thin merlot-colored urine without visible clot material.  Abdomen is flat and nondistended on physical exam today; no grimace with suprapubic palpation.  CT abdomen pelvis without contrast ordered earlier today.  Past Medical History:  Diagnosis Date  . 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 Chronic kidney disease   . COPD (chronic obstructive pulmonary disease) ([place])   . Eye injuries    multiple, Under care of Opthamology, Right Eye  . Hypertension   . Metabolic encephalopathy   . 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 No pertinent surgical history.  Home Medications:  Current Meds  Medication Sig  . amLODipine (NORVASC) 10 MG tablet Take 1 tablet (10 mg total) by mouth daily. (Patient taking differently: Take 5 mg by mouth daily. )  . carbamide peroxide (DEBROX) 6.5 % OTIC solution Place 5 drops into both ears 2 (two) times daily.  Marland Kitchen ketoconazole (NIZORAL) 2 % cream Apply 1 application topically daily.  Marland Kitchen LORazepam (ATIVAN) 0.5 MG tablet Take 0.25 mg by mouth daily as needed for anxiety.  .  memantine (NAMENDA XR) 28 MG CP24 24 hr capsule Take 28 mg by mouth daily.   . sertraline (ZOLOFT) 50 MG tablet Take 50 mg by mouth daily.    Allergies:  Allergies  Allergen Reactions  . Lisinopril     Chest Pain    Family History  Problem Relation Age of Onset  . Arthritis Mother   . Hypertension Sister   . Alcohol abuse Brother   . Asthma Brother   . Birth defects Brother     Social History:  reports that he quit smoking about 5 years ago. He has never used smokeless tobacco. He reports previous alcohol use. He reports that he does not use drugs.  ROS: A complete review of systems was performed.  All systems are negative except for pertinent findings as noted.  Physical Exam:  Vital signs in last 24 hours: Temp:  [97.9 F (36.6 C)-99.3 F (37.4 C)] 97.9 F (36.6 C) (01/27 1239) Pulse Rate:  [92-106] 92 (01/27 1239) Resp:  [17] 17 (01/27 0619) BP: (94-122)/(56-88) 94/72 (01/27 1239) SpO2:  [95 %-100 %] 100 % (01/27 1239) Constitutional: Nontoxic appearing, minimally arousable and only opens eyes when spoken to HEENT: [place] AT, moist mucus membranes.  Poor dentition Cardiovascular: No clubbing, cyanosis, or edema. Respiratory: Normal respiratory effort GI: See HPI GU: See HPI Skin: No rashes, bruises or suspicious lesions Neurologic: Minimal movement noted Psychiatric: Unable to assess  Laboratory Data:  Recent Labs    08/14/19 1128  WBC 8.9  HGB 11.9*  HCT 37.8*  Recent Labs    08/14/19 1128 08/15/19 0432 08/16/19 1005  NA 153* 154* 151*  K 4.4 3.9 3.9  CL 117* 122* 117*  CO2 24 24 24   GLUCOSE 117* 147* 121*  BUN 64* 58* 43*  CREATININE 2.97* 2.37* 2.24*  CALCIUM 10.2 9.6 9.6   Assessment & Plan:  79 year old male with advanced dementia admitted with poor p.o. intake, failure to thrive, and likely prerenal AKI with subsequent development of gross hematuria during hospitalization.  Patient's cognitive status limits assessment at this point.  Dark  red blood suggests old or upper tract bleeding and there are no overt signs of clot retention on physical exam today. Source is unclear at this time; will await CT results for further evaluation.  Foley catheterization not indicated at this time.  Recommend repeat CBC to trend hemoglobin.  Thank you for involving me in this patient's care, I will continue to follow along.  Debroah Loop, PA-C 08/16/2019 1:04 PM

## 2019-08-16 NOTE — Progress Notes (Signed)
PT Cancellation Note  Patient Details Name: Chad Banks MRN: [id number] DOB: 17-Dec-1940   Cancelled Treatment:    Reason Eval/Treat Not Completed: Patient's level of consciousness(Pt awake upon entry, alerts to entry, maintains eye contact whiel slumped over. Does not meaningfully response to all attempts to communicate. Suspect pt is at his baseline at this time, unable to participate in therapy. PT signing off.)  12:42 PM, 08/16/19 Etta Grandchild, PT, DPT Physical Therapist - [facility]  [phone] ([place])    Zemira Zehring C 08/16/2019, 12:41 PM

## 2019-08-16 NOTE — Progress Notes (Addendum)
[facility] [place] NAME: Chad Banks    MR#:  [id number]  DATE OF BIRTH:  1941/04/02  SUBJECTIVE:  patient has baseline dementia. I's open. Looks around tracks voice but does not communicate. Poor PO intake Significant hematuria noted--pt has external cath REVIEW OF SYSTEMS:   Review of Systems  Unable to perform ROS: Dementia   Tolerating Diet:poor intake Tolerating PT: does not pariticipate  DRUG ALLERGIES:   Allergies  Allergen Reactions  . Lisinopril     Chest Pain    VITALS:  Blood pressure 122/88, pulse 92, temperature 97.9 F (36.6 C), temperature source Oral, resp. rate 17, height 5\' 6"  (1.676 m), weight 49.7 kg, SpO2 95 %.  PHYSICAL EXAMINATION:   Physical Exam limited exam secondary to patient unable to participate  GENERAL:  79 y.o.-year-old patient lying in the bed with no acute distress. Thin, fraile EYES: Pupils equal, round, reactive to light and accommodation. Chronic right corneal opacity HEENT: Head atraumatic, normocephalic. Oropharynx and nasopharynx clear. Poor dentition NECK:  Supple, no jugular venous distention. No thyroid enlargement, no tenderness.  LUNGS: Normal breath sounds bilaterally, no wheezing, rales, rhonchi. No use of accessory muscles of respiration.  CARDIOVASCULAR: S1, S2 normal. No murmurs, rubs, or gallops.  ABDOMEN: Soft, nontender, nondistended. Bowel sounds present. No organomegaly or mass.  External cath EXTREMITIES: No cyanosis, clubbing or edema b/l.    NEUROLOGIC: no focal deficit. Moves all extremities well spontaneously PSYCHIATRIC:  patient is alert but does not communicate SKIN: per RN Pressure Injury 08/15/19 Buttocks Mid Stage 2 -  Partial thickness loss of dermis presenting as a shallow open injury with a red, pink wound bed without slough. (Active)  08/15/19 1325  Location: Buttocks  Location Orientation: Mid  Staging: Stage 2 -  Partial thickness loss of dermis  presenting as a shallow open injury with a red, pink wound bed without slough.  Wound Description (Comments):   Present on Admission: Yes    LABORATORY PANEL:  CBC Recent Labs  Lab 08/14/19 1128  WBC 8.9  HGB 11.9*  HCT 37.8*  PLT 352    Chemistries  Recent Labs  Lab 08/15/19 0432  NA 154*  K 3.9  CL 122*  CO2 24  GLUCOSE 147*  BUN 58*  CREATININE 2.37*  CALCIUM 9.6  MG 2.6*   Cardiac Enzymes No results for input(s): TROPONINI in the last 168 hours. RADIOLOGY:  CT Head Wo Contrast  Result Date: 08/14/2019 CLINICAL DATA:  Increasing weakness and decreased oral intake over the last week. EXAM: CT HEAD WITHOUT CONTRAST TECHNIQUE: Contiguous axial images were obtained from the base of the skull through the vertex without intravenous contrast. COMPARISON:  None. FINDINGS: Despite efforts by the technologist and patient, mild motion artifact is present on today's exam and could not be eliminated. This reduces exam sensitivity and specificity. Multiple images were repeated. Brain: There is no evidence of acute intracranial hemorrhage, mass lesion, brain edema or extra-axial fluid collection. There is mild atrophy with mild prominence of the ventricles and subarachnoid spaces. There are mild chronic small vessel ischemic changes with asymmetric periventricular encephalomalacia in the right temporal lobe. There is no CT evidence of acute cortical infarction. Vascular: Intracranial vascular calcifications. No hyperdense vessel identified. Skull: Negative for fracture or focal lesion. Sinuses/Orbits: The visualized paranasal sinuses and mastoid air cells are clear. No orbital abnormalities are seen. Other: Previous lens surgery on the right. IMPRESSION: 1. No acute intracranial findings. 2. Mild  atrophy and chronic small vessel ischemic changes with asymmetric periventricular encephalomalacia in the right temporal lobe. Electronically Signed   By: Richardean Sale M.D.   On: 08/14/2019 15:00    DG Chest Portable 1 View  Result Date: 08/14/2019 CLINICAL DATA:  Increasing weakness EXAM: PORTABLE CHEST 1 VIEW COMPARISON:  None. FINDINGS: Rotated. Minimal patchy density at the right lung base. No pleural effusion or pneumothorax. Cardiomediastinal contours are within normal limits. IMPRESSION: Minimal patchy atelectasis/consolidation at the right lung base. Electronically Signed   By: Macy Mis M.D.   On: 08/14/2019 13:35   ASSESSMENT AND PLAN:  Chad Banks  is a 79 y.o. 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 Acute encephalopathy, metabolic with acute on chronic renal failure III secondary to poor PO intake -IV fluids -- D5 w at 125cc/hr -monitor input output --baseline creatinine 1.7 -came in with creatinine of 2.97--2.3 -CT head negative for stroke, shows chronic atrophy -patient awake, alert does not communicate suspect due to underlying advanced dementia -Failure to thrive with poor po intake--long term poor prognosis  2. Hematuria (has external cath) - get renal US - urology consultation  3. Severe hypernatremia secondary to dehydration from poor PO intake -supportive management as above -153--154  4. Advanced dementia ? Alcoholic (per old record) -continue Zoloft and Namenda  5. History of hypertension -on amlodipine  6. DVT prophylaxis heparin  7. Pressure ulcer--POA Pressure Injury 08/15/19 Buttocks Mid Stage 2 -  Partial thickness loss of dermis presenting as a shallow open injury with a red, pink wound bed without slough. (Active)  08/15/19 1325  Location: Buttocks  Location Orientation: Mid  Staging: Stage 2 -  Partial thickness loss of dermis presenting as a shallow open injury with a red, pink  wound bed without slough.  Wound Description (Comments):   Present on Admission: Yes   palliative care consultation placed  8. FTT/poor po intake/Underwieght -seen by dietitian--follow recs   Procedures:none Family communication :pt has Legal guardian--Palliatvie care trying to reach her Consults : Discharge Disposition : likely family care home CODE STATUS: Full DVT Prophylaxis :SCD Barriers to discharge: abnormal labs/hematuria  TOTAL TIME TAKING CARE OF THIS PATIENT: *25* minutes.  >50% time spent on counselling and coordination of care  Note: This dictation was prepared with Dragon dictation along with smaller phrase technology. Any transcriptional errors that result from this process are unintentional.  Fritzi Mandes M.D    [facility]   CC: Primary care physician; Valerie Roys, DOPatient ID: Allegra Grana, male   DOB: 05-22-1941, 79 y.o.   MRN: [id number]

## 2019-08-16 NOTE — Consult Note (Signed)
Consultation Note Date: 08/16/2019   Patient Name: Chad Banks  DOB: [birth year]  MRN: [id number]  Age / Sex: 79 y.o., male  PCP: Valerie Roys, DO Referring Physician: Fritzi Mandes, MD  Reason for Consultation: Establishing goals of care  HPI/Patient Profile: 79 y.o. male  with past medical history of dementia, HTN, CKD, COPD, and ETOH use admitted on 08/14/2019 with poor PO intake and increasing weakness. Has remained alert but does not communicate. CT head shows chronic atrophy. Continues with poor PO intake despite attempts to feed. Now with hematuria from external catheter.  PMT consulted for [place] and failure to thrive.   Clinical Assessment and Goals of Care: I have reviewed medical records including EPIC notes, labs and imaging, received report from RN and Dr. Posey Pronto, assessed the patient and then met spoke with patient's legal guardian [name]  to discuss diagnosis prognosis, [place], EOL wishes, disposition and options.  I introduced Palliative Medicine as specialized medical care for people living with serious illness. It focuses on providing relief from the symptoms and stress of a serious illness. The goal is to improve quality of life for both the patient and the family.  [name] has been patient's guardian since 2018 - tells me his is typically interactive and jovial. She tells me he has declined rather rapidly.    We discussed his current illness and what it means in the larger context of his on-going co-morbidities.  Natural disease trajectory and expectations at EOL were discussed. She understands concerns about aspiration, poor PO intake, and overall failure to thrive. We discussed patient is likely nearing end of life.   Recommendations made for DNR status and hospice care - [name] discussed case with manager who agreed to these recommendations. [name] has spoken with patient's only family - a sister who does not live locally.    Questions and concerns were addressed.  [name] was encouraged to call with questions or concerns.   Primary Decision Maker LEGAL GUARDIAN - [name]    SUMMARY OF RECOMMENDATIONS    - code status changed to DNR - requested hospice to follow at facility   Code Status/Advance Care Planning:  DNR   Prognosis: poor prognosis: ?weeks, at risk for acute decompensation  Discharge Planning: ALF with hospice      Primary Diagnoses: Present on Admission: . Acute hypernatremia   I have reviewed the medical record, interviewed the patient and family, and examined the patient. The following aspects are pertinent.  Past Medical History:  Diagnosis Date  . 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 Chronic kidney disease   . COPD (chronic obstructive pulmonary disease) ([place])   . Eye injuries    multiple, Under care of Opthamology, Right Eye  . Hypertension   . Metabolic encephalopathy   . 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 Marital status: Single    Spouse name: Not on file  . Number of children: Not on file  . Years of education: Not on file  . Highest education level: Not on file  Occupational History  . Not on file  Tobacco Use  . Smoking status: Former Smoker    Quit date: 07/20/2014    Years since quitting: 5.0  . Smokeless tobacco: Never Used  Substance and Sexual Activity  . Alcohol use: Not Currently  . Drug use: No  . Sexual activity: Not on file  Other Topics Concern  . Not on file  Social History Narrative  . Not on file   Social Determinants of Health   Financial Resource Strain:   . Difficulty of Paying Living Expenses: Not on file  Food Insecurity:   . Worried About Charity fundraiser in the Last Year: Not on file  . Ran Out of Food in the Last Year: Not on file  Transportation Needs:   . Lack of Transportation (Medical): Not on file  . Lack of Transportation (Non-Medical): Not on file  Physical  Activity:   . Days of Exercise per Week: Not on file  . Minutes of Exercise per Session: Not on file  Stress:   . Feeling of Stress : Not on file  Social Connections:   . Frequency of Communication with Friends and Family: Not on file  . Frequency of Social Gatherings with Friends and Family: Not on file  . Attends Religious Services: Not on file  . Active Member of Clubs or Organizations: Not on file  . Attends Archivist Meetings: Not on file  . Marital Status: Not on file   Family History  Problem Relation Age of Onset  . Arthritis Mother   . Hypertension Sister   . Alcohol abuse Brother   . Asthma Brother   . Birth defects Brother    Scheduled Meds: . amLODipine  5 mg Oral Daily  . feeding supplement (ENSURE ENLIVE)  237 mL Oral BID BM  . LORazepam  1 mg Intravenous Once  . memantine  14 mg Oral Daily  . senna  1 tablet Oral BID  . sertraline  50 mg Oral Daily   Continuous Infusions: . dextrose 125 mL/hr at 08/16/19 0222   PRN Meds:.acetaminophen **OR** acetaminophen, ondansetron **OR** ondansetron (ZOFRAN) IV, polyethylene glycol Allergies  Allergen Reactions  . Lisinopril     Chest Pain   Review of Systems  Unable to perform ROS: Dementia    Physical Exam Constitutional:      General: He is not in acute distress.    Appearance: He is ill-appearing.     Comments: cachectic  HENT:     Head: Normocephalic and atraumatic.  Cardiovascular:     Rate and Rhythm: Normal rate and regular rhythm.  Pulmonary:     Effort: Pulmonary effort is normal.     Breath sounds: Normal breath sounds.  Genitourinary:    Comments: hematuria Musculoskeletal:     Right lower leg: No edema.     Left lower leg: No edema.  Skin:    General: Skin is warm and dry.  Neurological:     Mental Status: He is alert.     Comments: Does not respond verbally     Vital Signs: BP 121/84 (BP Location: Left Arm)   Pulse (!) 105   Temp 97.9 F (36.6 C) (Oral)   Resp 17   Ht  _0  (1.676 m)   Wt 49.7 kg   SpO2 99%   BMI 17.68 kg/m  Pain Scale: Faces   Pain Score: 0-No pain   SpO2: SpO2: 99 % O2 Device:SpO2: 99 % O2 Flow Rate: .O2 Flow Rate (L/min): 2 L/min  IO: Intake/output summary:   Intake/Output Summary (Last 24 hours) at 08/16/2019 1047 Last data filed at 08/15/2019 1500 Gross per 24 hour  Intake 429.59 ml  Output --  Net 429.59 ml    LBM: Last BM Date: (UTA) Baseline Weight: Weight: 52.2 kg Most recent weight: Weight: 49.7 kg     Palliative Assessment/Data: PPS 20%  Time Total: 50 minutes Greater than 50%  of this time was spent counseling and coordinating care related to the above assessment and plan.  Juel Burrow, DNP, AGNP-C Palliative Medicine Team [phone] Pager: [phone]

## 2019-08-16 NOTE — Progress Notes (Signed)
Patient ID: Chad Banks, male   DOB: 04/28/1941, 79 y.o.   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 D/w legal guardian [name] carr. Pt is DNR and not further w/u given poor prognosis. Hospice at discharge

## 2019-08-16 NOTE — Progress Notes (Signed)
OT Cancellation Note  Patient Details Name: Chad Banks MRN: [id number] DOB: July 17, 1941   Cancelled Treatment:    Reason Eval/Treat Not Completed: Patient not medically ready;Patient's level of consciousness. OT attempted x2 to see pt this date. Upon arrival to room, pt in bed in fetal position with knees flexed to abdomen. Pt opens eyes to VCs, but does not otherwise meaningfully engage with therapist. Pt unable to follow 1 step VCs, cannot state name, etc. OT assists pt with blanket repositioning for comfort. Pt left with safety mitts donned, bed alarm set. Pt not able to participate in therapy at this time. Will sign off. Please re-consult if level of cognition improves/pt able to participate with therapy.   Shara Blazing, M.S., OTR/L Ascom: [phone] 08/16/19, 2:12 PM

## 2019-08-16 NOTE — Evaluation (Signed)
Clinical/Bedside Swallow Evaluation Patient Details  Name: Chad Banks MRN: [id number] Date of Birth: Aug 08, 1940  Today's Date: 08/16/2019 Time: SLP Start Time (ACUTE ONLY): 61 SLP Stop Time (ACUTE ONLY): 1220 SLP Time Calculation (min) (ACUTE ONLY): 50 min  Past Medical History:  Past Medical History:  Diagnosis Date  . 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 Chronic kidney disease   . COPD (chronic obstructive pulmonary disease) ([place])   . Eye injuries    multiple, Under care of Opthamology, Right Eye  . Hypertension   . Metabolic encephalopathy   . Substance abuse ([place])    alcohol, polysubstance   Past Surgical History: History reviewed. No pertinent surgical history. HPI:  Pt is a 79 y.o. 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  Patient is being admitted with acute on chronic renal failure with severe dehydration and metabolic encephalopathy/altered mental status in the setting of Dementia.    Assessment / Plan / Recommendation Clinical Impression  Pt appears to present w/ Moderate+ oropharyngeal phase Dysphagia significantly impacted by his declined Cognitive status and overall awareness w/ oral intake/po trials. Positioning pt upright in bed for oral intake/trials was challenging -- difficult to position upright d/t pt preferring to remain in more of fetal position. He was easily agitated when moved despite explanation and encouragement. Pt was nonverbal mostly except for 2-3 mumbled phonations. He was resistant to po trials presented at lips and often turned his head away or bit on utensil. Oral care was NOT able to be completed d/t pt's Cognitive status and agitation at attempts. Pt was presented w/ few po trials of Nectar consistency liquids via Pinched straw and/or spoon and small amounts  of puree both placed anteriorly in mouth by SLP w/ encouragement. Pt exhibited a lengthy smacking/munching pattern behavior, then pharyngeal swallow. No overt clinical s/s of aspiration were noted w/ these trial consistencies but only few were given/accepted and No thin liquids were attempted. Vocal quality and laryngeal excursion could not be assessed d/t pt's Cognitive decline. Oral clearing was noted b/t trials given; No solids were attempted. Oral motor exam limited -- No unilateral weakness noted, decreased labial closure on spoon(more biting). Pt required total feeding assistance.  Recommend a Dysphagia level 1(puree) diet w/ Nectar consistency liquids (pinched straw, spoon); aspiration precautions; full feeding support but stop if too agitated; Pills Crushed in Puree for safer swallowing. Pt is at increased risk for Aspiration of oral intake and at risk for NOT meeting nutrition/hydration needs orally at this time d/t his significantly declined Cognitive status/awareness w/ oral intake tasks. NSG/MD updated. Palliative Care updated.  SLP Visit Diagnosis: Dysphagia, oropharyngeal phase (R13.12)(signfiicant Cognitive decline)    Aspiration Risk  Moderate+ aspiration risk;Risk for inadequate nutrition/hydration    Diet Recommendation  Dysphagia level 1 (puree w/ gravies) w/ NECTAR consistency liquids; strict aspiration precautions; full feeding support at meals - monitoring all oral intake for safety  Medication Administration: Crushed with puree    Other  Recommendations Recommended Consults: (Palliative Care consult; Dietician f/u) Oral Care Recommendations: Oral care QID;Oral care before and after PO;Staff/trained caregiver to provide oral care Other Recommendations: Order thickener from pharmacy;Prohibited food (jello, ice cream, thin soups);Remove water pitcher;Have oral suction available   Follow up Recommendations Skilled Nursing facility(TBD)      Frequency and Duration min 2x/week  2  weeks  Prognosis Prognosis for Safe Diet Advancement: Guarded Barriers to Reach Goals: Cognitive deficits;Language deficits;Time post onset;Severity of deficits;Behavior      Swallow Study   General Date of Onset: 08/14/19 HPI: Pt is a 79 y.o. 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  Patient is being admitted with acute on chronic renal failure with severe dehydration and metabolic encephalopathy/altered mental status in the setting of Dementia.  Type of Study: Bedside Swallow Evaluation Previous Swallow Assessment: none reported Diet Prior to this Study: Dysphagia 3 (soft);Thin liquids(at admit) Temperature Spikes Noted: No(wbc 8.9) Respiratory Status: Nasal cannula(2L) History of Recent Intubation: No Behavior/Cognition: Confused;Agitated;Uncooperative;Doesn't follow directions(Awake; needed cues/encouragement) Oral Cavity Assessment: (CNT d/t cognitive status) Oral Care Completed by SLP: No(could not perform d/t cognitive status) Oral Cavity - Dentition: Adequate natural dentition;Missing dentition(few) Vision: (n/a) Self-Feeding Abilities: Total assist Patient Positioning: Postural control adequate for testing(difficult to position upright d/t more of fetal position) Baseline Vocal Quality: (nonverbal) Volitional Cough: Cognitively unable to elicit Volitional Swallow: Unable to elicit    Oral/Motor/Sensory Function Overall Oral Motor/Sensory Function: (no unilateral weakness noted; no droop)   Ice Chips Ice chips: Not tested Other Comments: pt refused   Thin Liquid Thin Liquid: Not tested    Nectar Thick Nectar Thick Liquid: Impaired Presentation: Spoon;Straw(pinched straw; 5 trials) Oral Phase Impairments: Poor awareness of bolus Oral phase functional implications:  (spilling d/t cognitive status/acceptance of bolus) Pharyngeal Phase Impairments: (none)   Honey Thick Honey Thick Liquid: Not tested   Puree Puree: Impaired Presentation: Spoon(fed; 6 trials) Oral Phase Impairments: Reduced lingual movement/coordination;Poor awareness of bolus Oral Phase Functional Implications: (smacking/munching of boluses) Pharyngeal Phase Impairments: (none)   Solid     Solid: Not tested       Orinda Kenner, MS, CCC-SLP Emmelina Mcloughlin 08/16/2019,3:20 PM

## 2019-08-16 NOTE — Progress Notes (Signed)
OT Cancellation Note  Patient Details Name: Chad Banks MRN: [id number] DOB: [birth year]   Cancelled Treatment:    Reason Eval/Treat Not Completed: Patient not medically ready;Patient's level of consciousness. Consult received and chart reviewed. Per secure chat with pt primary RN, pt not complying with AM vitals--unable to participate in OT evaluation at this time. Will hold until later date/time as available and pt medically appropriate for OT evaluation.   Shara Blazing, M.S., OTR/L Ascom: [phone] 08/16/19, 9:10 AM

## 2019-08-17 ENCOUNTER — Telehealth: Payer: Self-pay | Admitting: Family Medicine

## 2019-08-17 DIAGNOSIS — R310 Gross hematuria: Secondary | ICD-10-CM

## 2019-08-17 DIAGNOSIS — R627 Adult failure to thrive: Secondary | ICD-10-CM

## 2019-08-17 DIAGNOSIS — F1027 Alcohol dependence with alcohol-induced persisting dementia: Secondary | ICD-10-CM

## 2019-08-17 DIAGNOSIS — N2889 Other specified disorders of kidney and ureter: Secondary | ICD-10-CM

## 2019-08-17 DIAGNOSIS — Z515 Encounter for palliative care: Secondary | ICD-10-CM

## 2019-08-17 DIAGNOSIS — R319 Hematuria, unspecified: Secondary | ICD-10-CM

## 2019-08-17 MED ORDER — MORPHINE SULFATE (CONCENTRATE) 10 MG/0.5ML PO SOLN
5.0000 mg | ORAL | Status: DC | PRN
  Administered 2019-08-17: 5 mg via ORAL
  Filled 2019-08-17: qty 0.5

## 2019-08-17 MED ORDER — LORAZEPAM 0.5 MG PO TABS: 0.5000 mg | ORAL_TABLET | ORAL | Status: DC | PRN

## 2019-08-17 MED ORDER — POLYETHYLENE GLYCOL 3350 17 G PO PACK: 17.0000 g | PACK | Freq: Every day | ORAL | 0 refills | Status: AC | PRN

## 2019-08-17 MED ORDER — LORAZEPAM 0.5 MG PO TABS: 0.5000 mg | ORAL_TABLET | ORAL | 0 refills | Status: AC | PRN

## 2019-08-17 MED ORDER — LORAZEPAM 0.5 MG PO TABS: 0.5000 mg | ORAL_TABLET | ORAL | 0 refills | Status: DC | PRN

## 2019-08-17 MED ORDER — MORPHINE SULFATE (CONCENTRATE) 10 MG/0.5ML PO SOLN: 5.0000 mg | ORAL | 0 refills | Status: DC | PRN

## 2019-08-17 MED ORDER — MORPHINE SULFATE (CONCENTRATE) 10 MG/0.5ML PO SOLN: 5.0000 mg | ORAL | 0 refills | Status: AC | PRN

## 2019-08-17 NOTE — Telephone Encounter (Signed)
Overall general decline in health Patient is being discharged from the hospital today, and they would like for him to have hospice and to see if you will still be his attending physician.

## 2019-08-17 NOTE — NC FL2 (Signed)
[place] LEVEL OF CARE SCREENING TOOL     IDENTIFICATION  Patient Name: Chad Banks Birthdate: 1940/10/14 Sex: male Admission Date (Current Location): 08/14/2019  [facility] and [place] Number:      Facility and Address:         Provider Number: [id number]  Attending Physician Name and Address:  Fritzi Mandes, MD  Relative Name and Phone Number:       Current Level of Care: Hospital Recommended Level of Care: Assisted Living Facility(with hospice) Prior Approval Number:    Date Approved/Denied:   PASRR Number:    Discharge Plan: Other (Comment)(assisted living with hospice)    Current Diagnoses: Patient Active Problem List   Diagnosis Date Noted  . Right renal mass   . Hematuria   . Pressure injury of skin 08/16/2019  . Failure to thrive in adult   . Dementia with behavioral disturbance ([place])   . Palliative care by specialist   . AKI (acute kidney injury) ([place]) 08/14/2019  . Encephalopathy   . Acute hypernatremia   . Hyperlipidemia 04/21/2017  . Benign hypertensive renal disease   . Chronic kidney disease   . 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[place])     Orientation RESPIRATION BLADDER Height & Weight        Normal Incontinent, External catheter Weight: 49.7 kg Height:  5\' 6"  (167.6 cm)  BEHAVIORAL SYMPTOMS/MOOD NEUROLOGICAL BOWEL NUTRITION STATUS      Incontinent Diet(dys 1 nectar thick)  AMBULATORY STATUS COMMUNICATION OF NEEDS Skin   Total Care Does not communicate PU Stage and Appropriate Care                       Personal Care Assistance Level of Assistance  Total care       Total Care Assistance: Maximum assistance   Functional Limitations Info             SPECIAL CARE FACTORS FREQUENCY                       Contractures Contractures Info: Not present    Additional Factors Info  Code Status, Allergies Code Status Info: DNR Allergies Info: lisinopril           Current Medications (08/17/2019):  This is the current  hospital active medication list Current Facility-Administered Medications  Medication Dose Route Frequency Provider Last Rate Last Admin  . acetaminophen (TYLENOL) tablet 650 mg  650 mg Oral Q6H PRN Fritzi Mandes, MD       Or  . acetaminophen (TYLENOL) suppository 650 mg  650 mg Rectal Q6H PRN Fritzi Mandes, MD      . dextrose 5 % solution   Intravenous Continuous Jennye Boroughs, MD 125 mL/hr at 08/17/19 0224 New Bag at 08/17/19 0224  . feeding supplement (ENSURE ENLIVE) (ENSURE ENLIVE) liquid 237 mL  237 mL Oral BID BM Jennye Boroughs, MD   237 mL at 08/16/19 1006  . LORazepam (ATIVAN) tablet 0.5 mg  0.5 mg Oral Q4H PRN Fritzi Mandes, MD      . MEDLINE mouth rinse  15 mL Mouth Rinse BID Fritzi Mandes, MD   Stopped at 08/16/19 2314  . morphine CONCENTRATE 10 MG/0.5ML oral solution 5 mg  5 mg Oral Q4H PRN Fritzi Mandes, MD      . ondansetron [facility]) tablet 4 mg  4 mg Oral Q6H PRN Fritzi Mandes, MD       Or  . ondansetron [facility]) injection 4 mg  4  mg Intravenous Q6H PRN Fritzi Mandes, MD      . polyethylene glycol (MIRALAX / GLYCOLAX) packet 17 g  17 g Oral Daily PRN Fritzi Mandes, MD         Discharge Medications: Please see discharge summary for a list of discharge medications.  Relevant Imaging Results:  Relevant Lab Results:   Additional Information    Laird Runnion, Illene Silver, RN

## 2019-08-17 NOTE — Progress Notes (Signed)
New referral for [facility] hospice services to follow at [facility] received from Palliative NP Kathie Rhodes, [place] aware. Patient has a DSS guardian who has agreed for services to be provided. Hospital bed has been requested for delivery on 1/28. Patient information given to referral. Patient will need a signed out pf facility DNR in place for EMS transfer at discharge. Will continue to fillow through discharge. Thank you for the opportunity to be involved in the care of this patient. Flo Shanks BSN, RN, [place] [phone]

## 2019-08-17 NOTE — TOC Transition Note (Signed)
Transition of Care [facility]) - CM/SW Discharge Note   Patient Details  Name: Chad Banks MRN: [id number] Date of Birth: 1940/12/15  Transition of [facility]) CM/SW Contact:  Beverly Sessions, RN Phone Number: 08/17/2019, 2:39 PM   Clinical Narrative:    Patient to discharge back to [facility] today  RNCM confirmed with Legal gaurdian Chad Banks at [facility] has confirmed hospital be delivered  EMS packet, signed DNR on chart.  Signed scripts and Fl2 in the discharge packet   Bedside RN notified    Final next level of care: Assisted Living(with hospice) Barriers to Discharge: Barriers Resolved   Patient Goals and CMS Choice        Discharge Placement                Patient to be transferred to facility by: EMS Name of family member notified: Gaurdian Chad Banks Patient and family notified of of transfer: 08/17/19  Discharge Plan and Services                                     Social Determinants of Health (SDOH) Interventions     Readmission Risk Interventions No flowsheet data found.

## 2019-08-17 NOTE — Telephone Encounter (Signed)
Copied from [place] [id number]. Topic: General - Inquiry >> Aug 17, 2019 10:49 AM Alease Frame wrote: Reason for CRM: Alyse Low calling from [facility] requesting a referral for pt . Please call back   IN:[phone]

## 2019-08-17 NOTE — Progress Notes (Signed)
EMS called for transport.

## 2019-08-17 NOTE — Discharge Summary (Signed)
[facility] NAME: Chad Banks    MR#:  [id number]  DATE OF BIRTH:  January 21, 1941  DATE OF ADMISSION:  08/14/2019 ADMITTING PHYSICIAN: Fritzi Mandes, MD  DATE OF DISCHARGE: 08/17/2019  PRIMARY CARE PHYSICIAN: Valerie Roys, DO    ADMISSION DIAGNOSIS:  Acute renal failure (HCC) [N17.9] Encephalopathy [G93.40] AKI (acute kidney injury) ([place]) [N17.9]  DISCHARGE DIAGNOSIS:  Acute metabolic encephalopathy Adult Failure to thrive/Poor po intake/underweight Acute on chronic Renal failure with severe Hypernatremia Hematuria suspected from Large Right kidney mass worrisome for RCC  SECONDARY DIAGNOSIS:   Past Medical History:  Diagnosis Date  . 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 Chronic kidney disease   . COPD (chronic obstructive pulmonary disease) ([place])   . Eye injuries    multiple, Under care of Opthamology, Right Eye  . Hypertension   . Metabolic encephalopathy   . Substance abuse ([place])    alcohol, polysubstance    HOSPITAL COURSE:  RobertHesteris a79 y.o.malewith a known history of dementia, hypertension, chronic kidney disease stage III secondary to hypertension,history of alcoholism, chronic right eye injury, COPD comes to the emergency room from [facility] [facility] with poor PO intake and increasing weakness for one week  1.Acute encephalopathy,metabolic with acute on chronic renal failure III secondary to poor PO intake/Adult FTT/Underweight -CT head negative for stroke,shows chronic atrophy -patient does not communicate suspect due to underlying advanced dementia -Failure to thrive with poor po intake--long term poor prognosis  2.Hematuria (has external cath) suspected due to large mass right lower pole worrisome for RCC - poor prognosis -no further w/u after d/w Legal guardian [name].  -Dr Erlene Quan aware  3.Severe hypernatremia secondary to dehydration from poor PO intake -supportive  management  -153--154  4.Advanced dementia ?Alcoholic (perold record)  5.History of hypertension  6.DVT prophylaxis SCD  7. Pressure ulcer--POA Pressure Injury 08/15/19 Buttocks Mid Stage 2 -  Partial thickness loss of dermis presenting as a shallow open injury with a red, pink wound bed without slough. (Active)  08/15/19 1325  Location: Buttocks  Location Orientation: Mid  Staging: Stage 2 -  Partial thickness loss of dermis presenting as a shallow open injury with a red, pink wound bed without slough.  Wound Description (Comments):   Present on Admission: Yes   palliative care consultation appreciated. ot is DNR./DNI and will discharge to [facility] care home with Hospice.  Procedures:none Family communication : Legal guardian aware Consults :Urilogy Discharge Disposition :family care home with Hospice today CODE STATUS: 79NR DVT Prophylaxis :SCD Barriers to discharge: none  CONSULTS OBTAINED:  Treatment Team:  Hollice Espy, MD  DRUG ALLERGIES:   Allergies  Allergen Reactions  . Lisinopril     Chest Pain    DISCHARGE MEDICATIONS:   Allergies as of 08/17/2019      Reactions   Lisinopril    Chest Pain      Medication List    STOP taking these medications   amLODipine 10 MG tablet Commonly known as: NORVASC   Debrox 6.5 % OTIC solution Generic drug: carbamide peroxide   ketoconazole 2 % cream Commonly known as: NIZORAL   memantine 28 MG Cp24 24 hr capsule Commonly known as: NAMENDA XR   sertraline 50 MG tablet Commonly known as: ZOLOFT     TAKE these medications   LORazepam 0.5 MG tablet Commonly known as: ATIVAN Take 1 tablet (0.5 mg total) by mouth every 4 (four) hours as needed for anxiety. What  changed:  how much to take when to take this   morphine CONCENTRATE 10 MG/0.5ML Soln concentrated solution Take 0.25 mLs (5 mg total) by mouth every 4 (four) hours as needed for moderate pain, severe pain or anxiety.   polyethylene  glycol 17 g packet Commonly known as: MIRALAX / GLYCOLAX Take 17 g by mouth daily as needed for mild constipation.       If you experience worsening of your admission symptoms, develop shortness of breath, life threatening emergency, suicidal or homicidal thoughts you must seek medical attention immediately by calling 911 or calling your MD immediately  if symptoms less severe.  You Must read complete instructions/literature along with all the possible adverse reactions/side effects for all the Medicines you take and that have been prescribed to you. Take any new Medicines after you have completely understood and accept all the possible adverse reactions/side effects.   Please note  You were cared for by a hospitalist during your hospital stay. If you have any questions about your discharge medications or the care you received while you were in the hospital after you are discharged, you can call the unit and asked to speak with the hospitalist on call if the hospitalist that took care of you is not available. Once you are discharged, your primary care physician will handle any further medical issues. Please note that NO REFILLS for any discharge medications will be authorized once you are discharged, as it is imperative that you return to your primary care physician (or establish a relationship with a primary care physician if you do not have one) for your aftercare needs so that they can reassess your need for medications and monitor your lab values. Today   SUBJECTIVE   No new issues per RN resting  VITAL SIGNS:  Blood pressure 113/69, pulse 97, temperature 99.9 F (37.7 C), temperature source Oral, resp. rate 16, height 5\' 6"  (1.676 m), weight 49.7 kg, SpO2 98 %.  I/O:    Intake/Output Summary (Last 24 hours) at 08/17/2019 0833 Last data filed at 08/17/2019 0224 Gross per 24 hour  Intake 1680.92 ml  Output 1350 ml  Net 330.92 ml    PHYSICAL EXAMINATION:  Limited--comfort  care  GENERAL:  79 y.o.-year-old patient lying in the bed with no acute distress. Thin,cachectic LUNGS: Normal breath sounds bilaterally. No use of accessory muscles of respiration.  CARDIOVASCULAR: S1, S2 normal. No murmurs, rubs, or gallops.  ABDOMEN: Soft, non-tender, non-distended. External cath+ dark cola colored urine EXTREMITIES: No pedal edema, cyanosis, or clubbing.  PSYCHIATRIC: resting comfortably SKIN: chronic decubitus per RN document POA  DATA REVIEW:   CBC  Recent Labs  Lab 08/14/19 1128  WBC 8.9  HGB 11.9*  HCT 37.8*  PLT 352    Chemistries  Recent Labs  Lab 08/15/19 0432 08/15/19 0432 08/16/19 1005  NA 154*   < > 151*  K 3.9   < > 3.9  CL 122*   < > 117*  CO2 24   < > 24  GLUCOSE 147*   < > 121*  BUN 58*   < > 43*  CREATININE 2.37*   < > 2.24*  CALCIUM 9.6   < > 9.6  MG 2.6*  --   --    < > = values in this interval not displayed.    Microbiology Results   Recent Results (from the past 240 hour(s))  SARS CORONAVIRUS 2 (TAT 6-24 HRS) Nasopharyngeal Nasopharyngeal Swab     Status: None  Collection Time: 08/14/19  2:24 PM   Specimen: Nasopharyngeal Swab  Result Value Ref Range Status   SARS Coronavirus 2 NEGATIVE NEGATIVE Final    Comment: (NOTE) SARS-CoV-2 target nucleic acids are NOT DETECTED. The SARS-CoV-2 RNA is generally detectable in upper and lower respiratory specimens during the acute phase of infection. Negative results do not preclude SARS-CoV-2 infection, do not rule out co-infections with other pathogens, and should not be used as the sole basis for treatment or other patient management decisions. Negative results must be combined with clinical observations, patient history, and epidemiological information. The expected result is Negative. Fact Sheet for Patients: SugarRoll.be Fact Sheet for Healthcare Providers: https://www.woods-mathews.com/ This test is not yet approved or cleared by  the [place] FDA and  has been authorized for detection and/or diagnosis of SARS-CoV-2 by FDA under an Emergency Use Authorization (EUA). This EUA will remain  in effect (meaning this test can be used) for the duration of the COVID-19 declaration under Section 56 4(b)(1) of the Act, 21 U.S.C. section 360bbb-3(b)(1), unless the authorization is terminated or revoked sooner. Performed at [facility], [place] [address], [place], [place] [address]   Blood culture (routine x 2)     Status: None (Preliminary result)   Collection Time: 08/14/19  4:45 PM   Specimen: BLOOD  Result Value Ref Range Status   Specimen Description BLOOD RIGHT ANTECUBITAL  Final   Special Requests   Final    BOTTLES DRAWN AEROBIC AND ANAEROBIC Blood Culture adequate volume   Culture   Final    NO GROWTH 3 DAYS Performed at [facility], [address], [place], [place] [address]    Report Status PENDING  Incomplete    RADIOLOGY:  CT ABDOMEN PELVIS WO CONTRAST  Result Date: 08/16/2019 CLINICAL DATA:  Hematuria. EXAM: CT ABDOMEN AND PELVIS WITHOUT CONTRAST TECHNIQUE: Multidetector CT imaging of the abdomen and pelvis was performed following the standard protocol without IV contrast. COMPARISON:  None. FINDINGS: Lower chest: No acute abnormality. Two 4 mm ground-glass nodules in the right middle lobe (series 4, images 2 and 5). Subsegmental atelectasis in the right lower lobe. Hepatobiliary: No focal liver abnormality is seen. No gallstones, gallbladder wall thickening, or biliary dilatation. Pancreas: Unremarkable. No pancreatic ductal dilatation or surrounding inflammatory changes. Spleen: Normal in size without focal abnormality. Adrenals/Urinary Tract: Adrenal glands are unremarkable. Large 5.5 x 4.8 x 4.8 cm heterogeneous mass in the lower pole of the right kidney. There is mild hydronephrosis of the upper pole with hyperdense material in the renal collecting system. Bilateral renal simple cysts  measuring up to 3.0 cm. Multiple areas of cortical scarring in the left kidney. No left hydronephrosis. No renal calculi. Small amount of layering hyperdensity within the bladder. No bladder wall thickening. Stomach/Bowel: Stomach is within normal limits. Appendix appears normal. No evidence of bowel wall thickening, distention, or inflammatory changes. Moderate stool ball in the rectum. Vascular/Lymphatic: Enlarged right para-aortic lymph node measuring 1.8 cm in short axis (series 2, image 29). No additional lymphadenopathy. Aortic atherosclerosis. Reproductive: Prostate is unremarkable. Other: Small right inguinal hernia containing a knuckle of cecum. No free fluid or pneumoperitoneum. Musculoskeletal: No acute or significant osseous findings. IMPRESSION: 1. Large 5.5 cm heterogeneous mass in the lower pole of the right kidney, concerning for renal cell carcinoma. The mass obstructs the upper pole with resultant mild hydronephrosis. Hyperdense material in the right renal collecting system and a small amount layering within the bladder, consistent with hemorrhage. 2. Enlarged  1.8 cm right para-aortic lymph node, concerning for nodal metastatic disease. 3. Two 4 mm ground-glass nodules in the right middle lobe are nonspecific. Non-contrast chest CT at 3-6 months is recommended to evaluate for interval change. 4.  Aortic atherosclerosis (ICD10-I70.0). Electronically Signed   By: Titus Dubin M.D.   On: 08/16/2019 17:14     CODE STATUS:     Code Status Orders  (From admission, onward)         Start     Ordered   08/16/19 1538  Do not attempt resuscitation (DNR)  Continuous    Question Answer Comment  In the event of cardiac or respiratory ARREST Do not call a "code blue"   In the event of cardiac or respiratory ARREST Do not perform Intubation, CPR, defibrillation or ACLS   In the event of cardiac or respiratory ARREST Use medication by any route, position, wound care, and other measures to relive  pain and suffering. May use oxygen, suction and manual treatment of airway obstruction as needed for comfort.      08/16/19 1537        Code Status History    Date Active Date Inactive Code Status Order ID Comments User Context   08/14/2019 1851 08/16/2019 1537 Full Code [id number]  Fritzi Mandes, MD ED   Advance Care Planning Activity       TOTAL TIME TAKING CARE OF THIS PATIENT: *40* minutes.    Fritzi Mandes M.D  [facility]    CC: Primary care physician; Valerie Roys, DO

## 2019-08-17 NOTE — Telephone Encounter (Signed)
Referral for what??

## 2019-08-17 NOTE — Care Management Important Message (Signed)
Important Message  Patient Details  Name: KASHTEN SHUEY MRN: [id number] Date of Birth: [birth year]   Medicare Important Message Given:  Other (see comment)  Message left with DSS guardian at [phone] to review Medicare IM.  Attempted to reach via cell, however voicemail full.     Dannette Barbara 08/17/2019, 1:43 PM

## 2019-08-18 NOTE — Telephone Encounter (Signed)
Referral in and yes, I will continue to act as attending

## 2019-08-18 NOTE — Telephone Encounter (Signed)
Verbal order given  

## 2019-08-18 NOTE — Telephone Encounter (Signed)
Christy calling back regarding. She would like to know if Dr. Wynetta Emery agreed with hospice and if would be his attending physician. She would like to have patient seen today.

## 2019-08-19 LAB — CULTURE, BLOOD (ROUTINE X 2)
Culture: NO GROWTH
Special Requests: ADEQUATE

## 2019-08-21 ENCOUNTER — Telehealth: Payer: Self-pay | Admitting: Family Medicine

## 2019-08-29 ENCOUNTER — Ambulatory Visit: Payer: Medicare Other | Admitting: Family Medicine

## 2019-09-18 NOTE — Telephone Encounter (Signed)
Yes, I will sign

## 2019-09-18 NOTE — Telephone Encounter (Signed)
Officer Landry Mellow called back and is aware Dr Wynetta Emery will sign the death certificate.

## 2019-09-18 NOTE — Telephone Encounter (Signed)
Copied from [place] [id number]. Topic: General - Other >> 09-15-2019  8:01 AM Celene Kras wrote: Reason for CRM: Officer Landry Mellow, called stating that the pt passed away this morning. He is requesting to have the pts death certificate signed. Please advise. [phone]

## 2019-09-18 DEATH — deceased

## 2021-01-07 IMAGING — CT CT HEAD W/O CM
4 of 9 series · 15 of 47 positions shown, 17 images · non-contrast
Comparison: None.

CLINICAL DATA: Increasing weakness and decreased oral intake over
the last week.

EXAM:
CT HEAD WITHOUT CONTRAST
TECHNIQUE: Contiguous axial images were obtained from the base of the skull
through the vertex without intravenous contrast.

[Series 4: head wo · axial · 0.33mm/px · z∈[-21,+76]mm · 5 of 31 slices shown, 7 images (1 of 2)]
[im 6/31  brain]
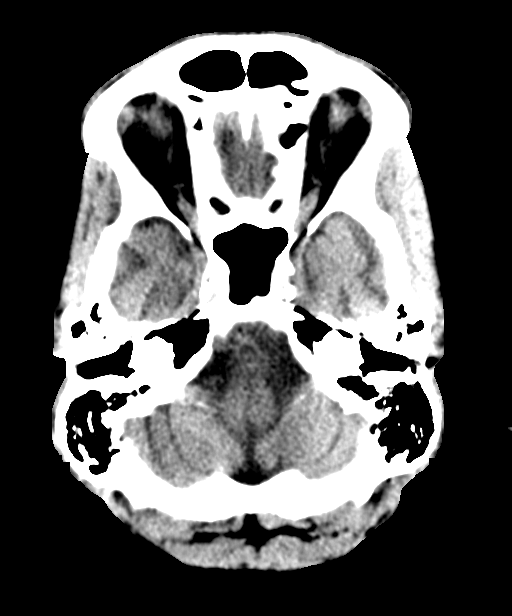
[im 6/31  bone]
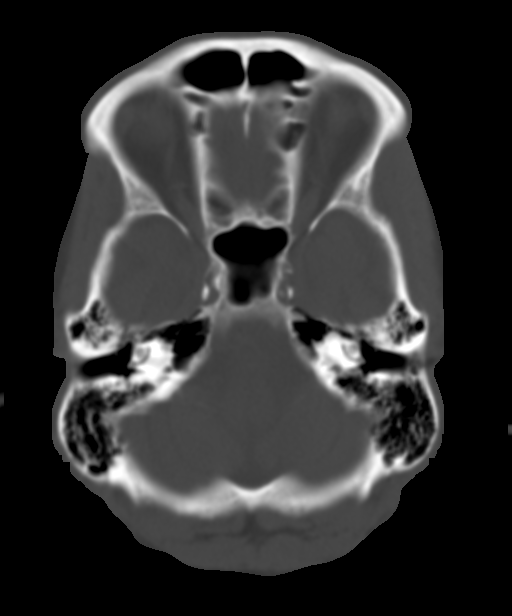
[im 11/31  brain]
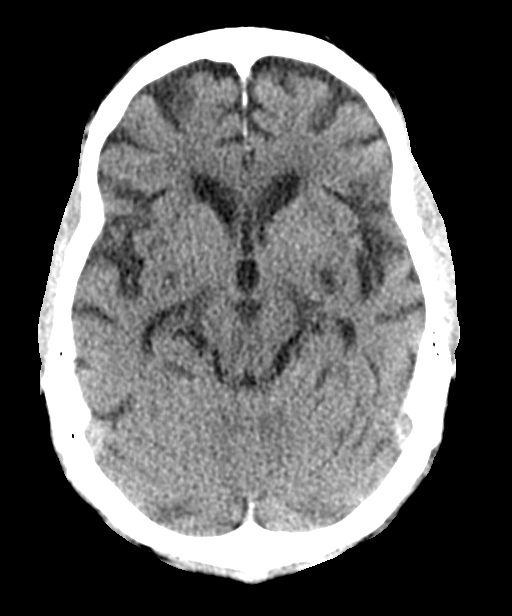
[im 16/31  brain]
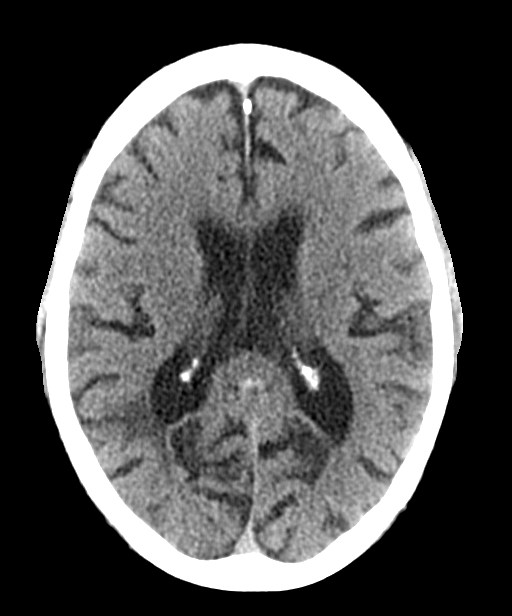
[im 21/31  brain]
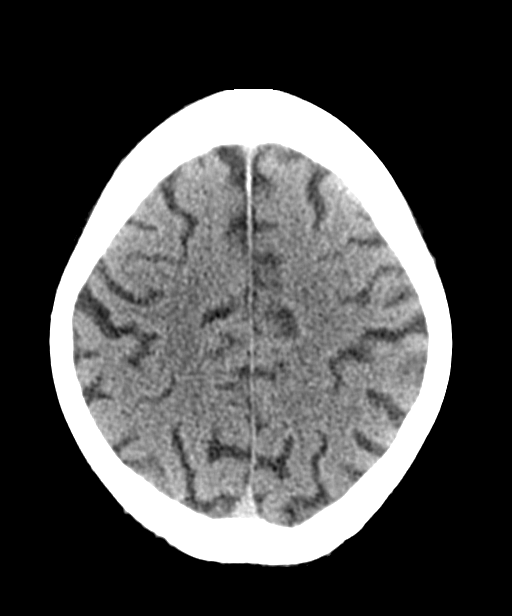
[im 26/31  brain]
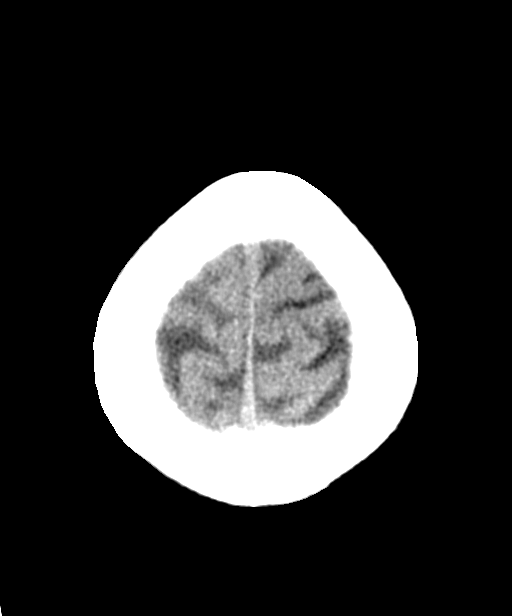
[im 26/31  bone]
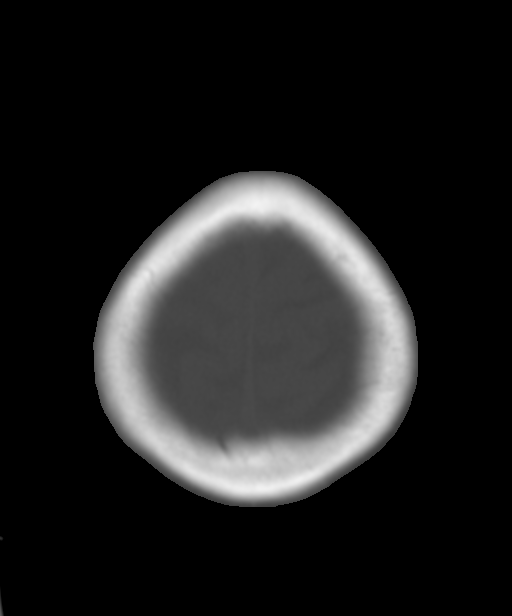

[Series 6: coronal soft tissue · coronal · 0.30mm/px · 3 of 61 slices shown]
[im 16/61  brain]
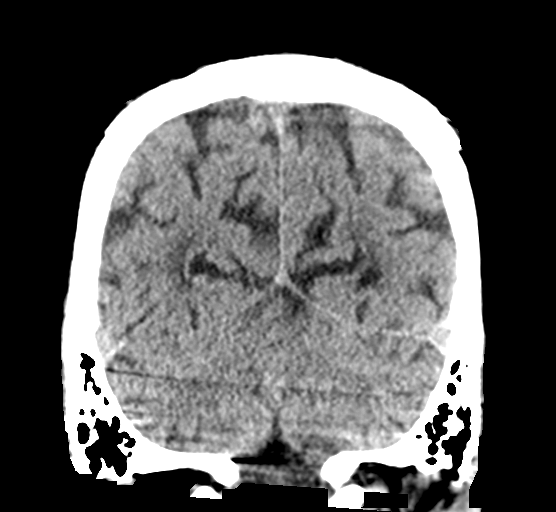
[im 31/61  brain]
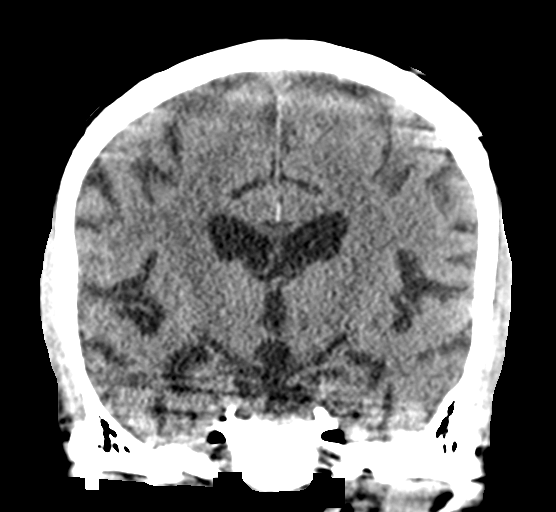
[im 46/61  brain]
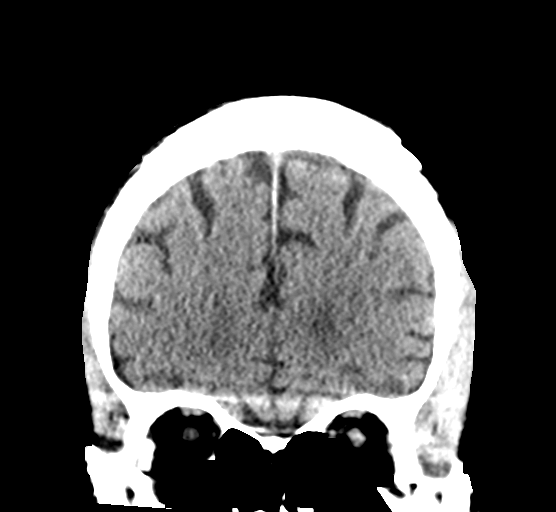

[Series 8: head wo · axial · 0.42mm/px · z∈[-30,+40]mm · 4 of 29 slices shown (2 of 2)]
[im 5/29  brain]
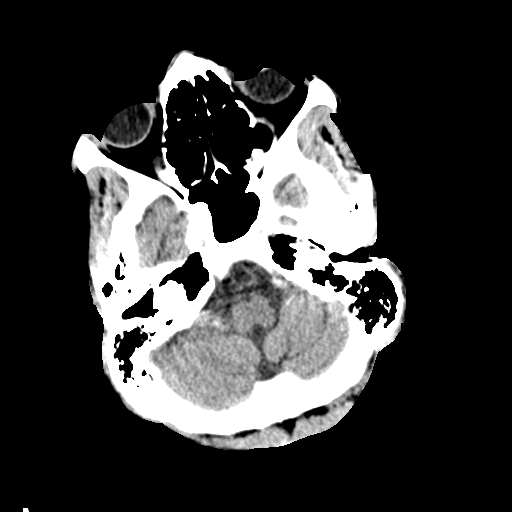
[im 10/29  brain]
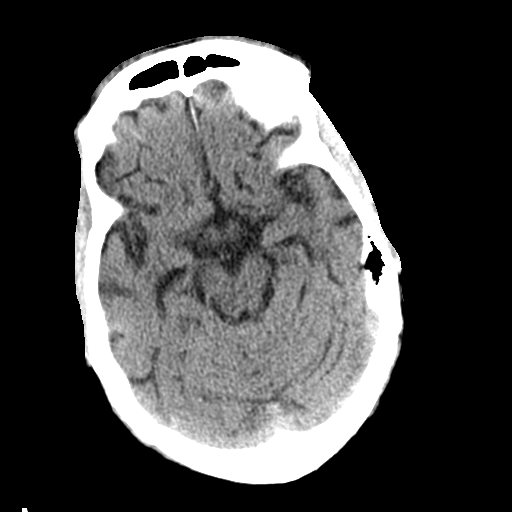
[im 15/29  brain]
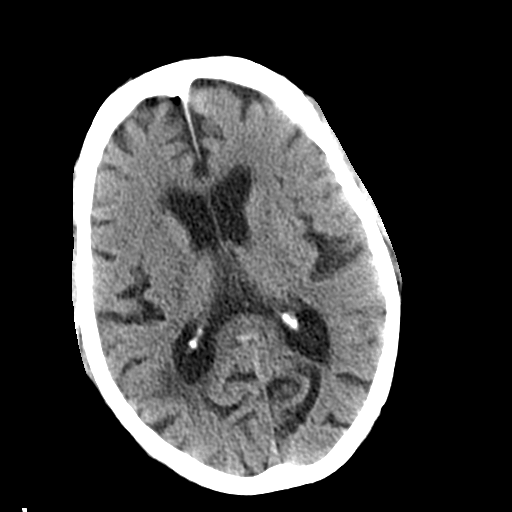
[im 19/29  brain]
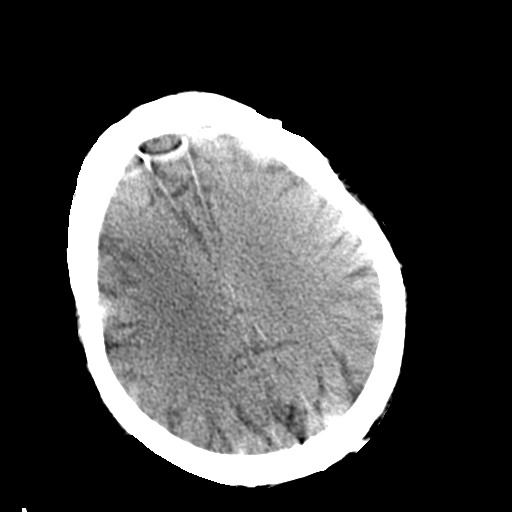

[Series 11: sagittal soft tissue · sagittal · 0.31mm/px · 3 of 49 slices shown]
[im 14/49  brain]
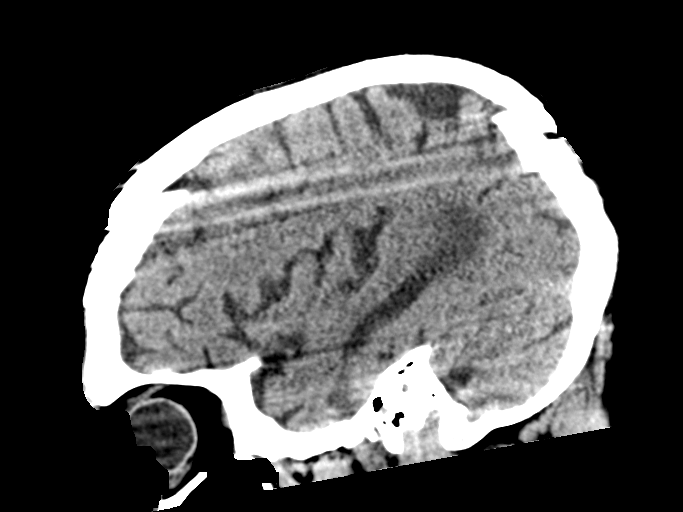
[im 27/49  brain]
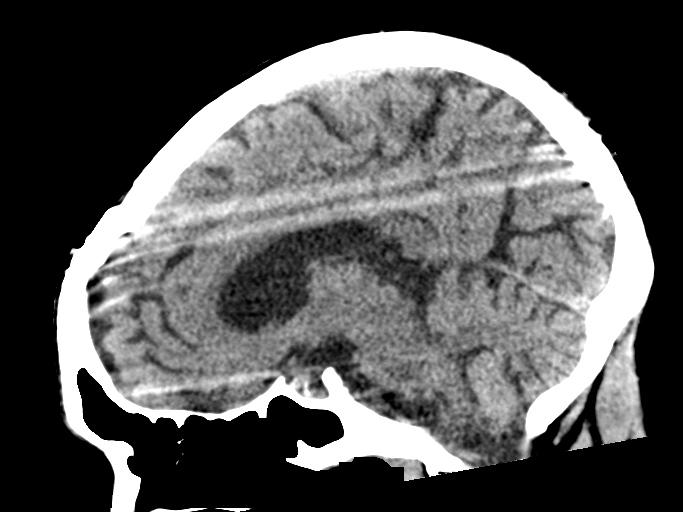
[im 41/49  brain]
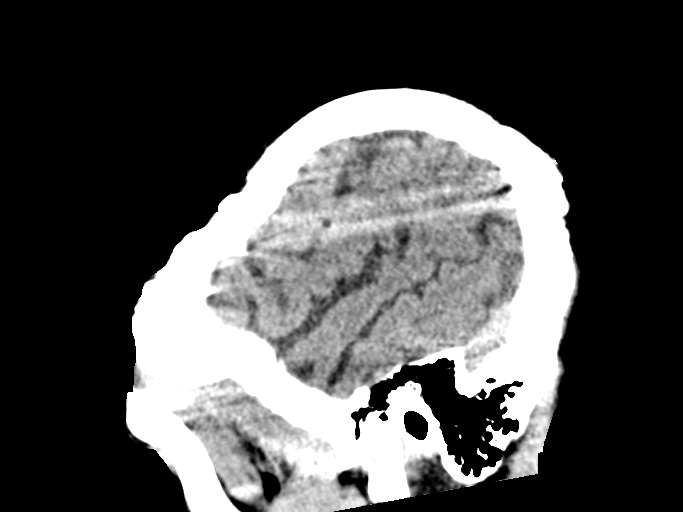

[15 of 47 positions shown; findings below may reference images not displayed]

FINDINGS: Despite efforts by the technologist and patient, mild motion
artifact is present on today's exam and could not be eliminated.
This reduces exam sensitivity and specificity. Multiple images were
repeated.

Brain: There is no evidence of acute intracranial hemorrhage, mass
lesion, brain edema or extra-axial fluid collection. There is mild
atrophy with mild prominence of the ventricles and subarachnoid
spaces. There are mild chronic small vessel ischemic changes with
asymmetric periventricular encephalomalacia in the right temporal
lobe. There is no CT evidence of acute cortical infarction.

Vascular: Intracranial vascular calcifications. No hyperdense vessel
identified.

Skull: Negative for fracture or focal lesion.

Sinuses/Orbits: The visualized paranasal sinuses and mastoid air
cells are clear. No orbital abnormalities are seen.

Other: Previous lens surgery on the right.
IMPRESSION: 1. No acute intracranial findings.
2. Mild atrophy and chronic small vessel ischemic changes with
asymmetric periventricular encephalomalacia in the right temporal
lobe.

## 2021-01-07 IMAGING — DX DG CHEST 1V PORT
1 series · 1 of 1 positions shown · non-contrast
Comparison: None.

CLINICAL DATA: Increasing weakness

EXAM:
PORTABLE CHEST 1 VIEW

[chest ap]
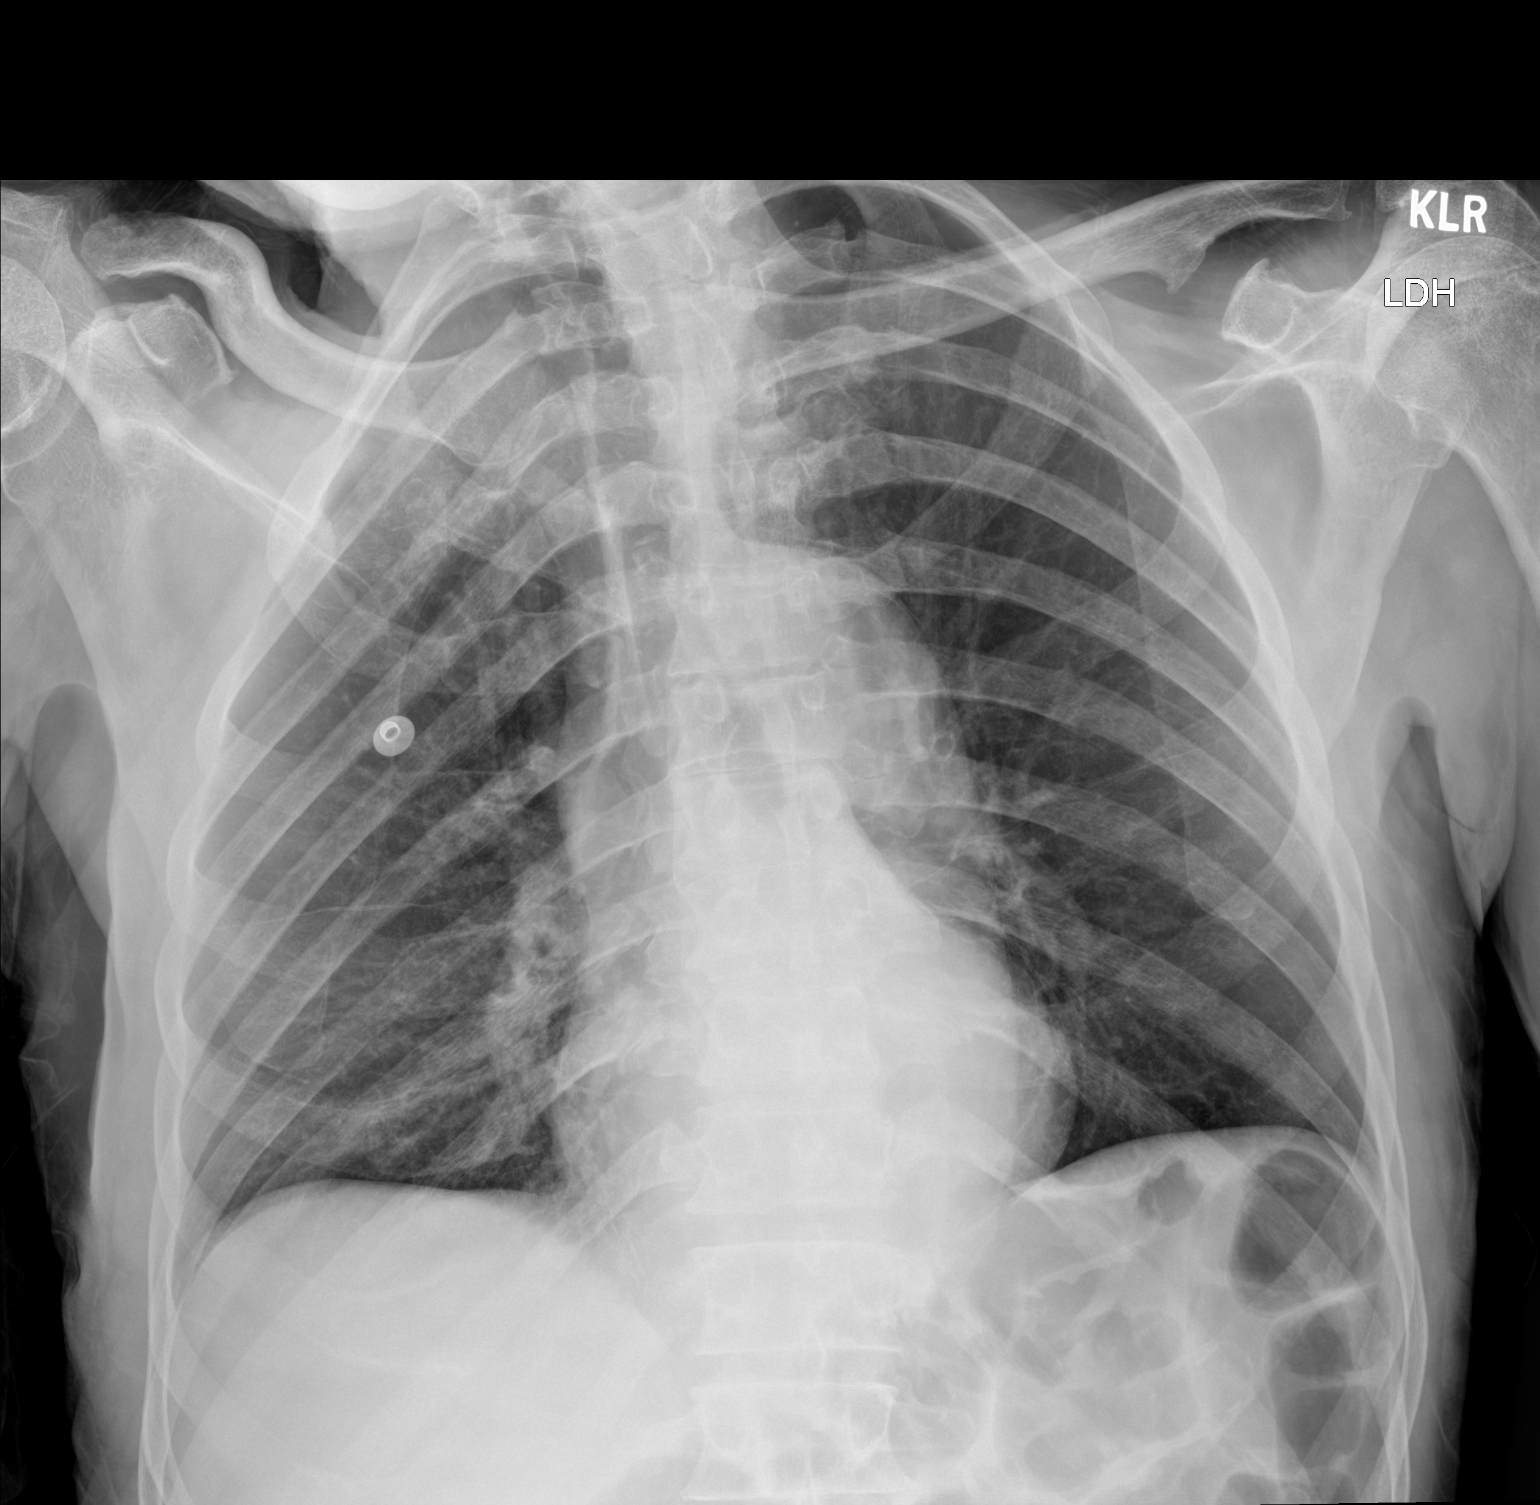

[1 of 1 positions shown; findings below may reference images not displayed]

FINDINGS: Rotated. Minimal patchy density at the right lung base. No pleural
effusion or pneumothorax. Cardiomediastinal contours are within
normal limits.
IMPRESSION: Minimal patchy atelectasis/consolidation at the right lung base.
